# Patient Record
Sex: Female | Born: 1950 | Race: Black or African American | Hispanic: No | Smoking: Former smoker
Health system: Southern US, Community
[De-identification: ages and names within clinical notes are randomized; demographics above are authoritative.]

## PROBLEM LIST (undated history)

## (undated) DIAGNOSIS — I219 Acute myocardial infarction, unspecified: Secondary | ICD-10-CM

## (undated) DIAGNOSIS — R079 Chest pain, unspecified: Secondary | ICD-10-CM

## (undated) DIAGNOSIS — K5792 Diverticulitis of intestine, part unspecified, without perforation or abscess without bleeding: Secondary | ICD-10-CM

## (undated) DIAGNOSIS — G8929 Other chronic pain: Secondary | ICD-10-CM

## (undated) DIAGNOSIS — E669 Obesity, unspecified: Secondary | ICD-10-CM

## (undated) DIAGNOSIS — I10 Essential (primary) hypertension: Secondary | ICD-10-CM

## (undated) DIAGNOSIS — IMO0001 Reserved for inherently not codable concepts without codable children: Secondary | ICD-10-CM

## (undated) DIAGNOSIS — I428 Other cardiomyopathies: Secondary | ICD-10-CM

## (undated) DIAGNOSIS — M1990 Unspecified osteoarthritis, unspecified site: Secondary | ICD-10-CM

## (undated) DIAGNOSIS — E039 Hypothyroidism, unspecified: Secondary | ICD-10-CM

## (undated) DIAGNOSIS — I739 Peripheral vascular disease, unspecified: Secondary | ICD-10-CM

## (undated) DIAGNOSIS — M797 Fibromyalgia: Secondary | ICD-10-CM

## (undated) DIAGNOSIS — F329 Major depressive disorder, single episode, unspecified: Secondary | ICD-10-CM

## (undated) DIAGNOSIS — E7800 Pure hypercholesterolemia, unspecified: Secondary | ICD-10-CM

## (undated) DIAGNOSIS — F1910 Other psychoactive substance abuse, uncomplicated: Secondary | ICD-10-CM

## (undated) DIAGNOSIS — F32A Depression, unspecified: Secondary | ICD-10-CM

## (undated) HISTORY — PX: ABDOMINAL HYSTERECTOMY: SHX81

## (undated) HISTORY — DX: Other chronic pain: G89.29

## (undated) HISTORY — PX: HAND SURGERY: SHX662

## (undated) HISTORY — DX: Obesity, unspecified: E66.9

## (undated) HISTORY — DX: Chest pain, unspecified: R07.9

## (undated) HISTORY — PX: OTHER SURGICAL HISTORY: SHX169

## (undated) HISTORY — PX: KIDNEY STONE SURGERY: SHX686

## (undated) HISTORY — PX: CARDIAC CATHETERIZATION: SHX172

## (undated) HISTORY — DX: Pure hypercholesterolemia, unspecified: E78.00

## (undated) HISTORY — PX: BACK SURGERY: SHX140

## (undated) HISTORY — PX: SHOULDER SURGERY: SHX246

---

## 1998-03-29 ENCOUNTER — Inpatient Hospital Stay (HOSPITAL_COMMUNITY): Admission: EM | Admit: 1998-03-29 | Discharge: 1998-04-03 | Payer: Self-pay | Admitting: *Deleted

## 1998-06-15 ENCOUNTER — Encounter: Payer: Self-pay | Admitting: Radiation Oncology

## 1998-06-15 ENCOUNTER — Inpatient Hospital Stay (HOSPITAL_COMMUNITY): Admission: EM | Admit: 1998-06-15 | Discharge: 1998-06-17 | Payer: Self-pay | Admitting: Emergency Medicine

## 1998-12-19 ENCOUNTER — Encounter: Payer: Self-pay | Admitting: Emergency Medicine

## 1998-12-19 ENCOUNTER — Emergency Department (HOSPITAL_COMMUNITY): Admission: EM | Admit: 1998-12-19 | Discharge: 1998-12-19 | Payer: Self-pay | Admitting: Emergency Medicine

## 1999-03-03 ENCOUNTER — Inpatient Hospital Stay (HOSPITAL_COMMUNITY): Admission: EM | Admit: 1999-03-03 | Discharge: 1999-03-05 | Payer: Self-pay | Admitting: Emergency Medicine

## 1999-05-30 ENCOUNTER — Ambulatory Visit (HOSPITAL_COMMUNITY): Admission: RE | Admit: 1999-05-30 | Discharge: 1999-05-30 | Payer: Self-pay | Admitting: Family Medicine

## 1999-05-30 ENCOUNTER — Encounter: Payer: Self-pay | Admitting: Family Medicine

## 1999-05-31 ENCOUNTER — Encounter: Payer: Self-pay | Admitting: Emergency Medicine

## 1999-05-31 ENCOUNTER — Emergency Department (HOSPITAL_COMMUNITY): Admission: EM | Admit: 1999-05-31 | Discharge: 1999-05-31 | Payer: Self-pay | Admitting: Emergency Medicine

## 1999-07-22 ENCOUNTER — Emergency Department (HOSPITAL_COMMUNITY): Admission: EM | Admit: 1999-07-22 | Discharge: 1999-07-22 | Payer: Self-pay | Admitting: Emergency Medicine

## 1999-10-11 ENCOUNTER — Emergency Department (HOSPITAL_COMMUNITY): Admission: EM | Admit: 1999-10-11 | Discharge: 1999-10-11 | Payer: Self-pay | Admitting: Emergency Medicine

## 1999-11-13 ENCOUNTER — Emergency Department (HOSPITAL_COMMUNITY): Admission: EM | Admit: 1999-11-13 | Discharge: 1999-11-13 | Payer: Self-pay

## 1999-12-10 ENCOUNTER — Encounter: Admission: RE | Admit: 1999-12-10 | Discharge: 1999-12-10 | Payer: Self-pay | Admitting: Orthopedic Surgery

## 1999-12-10 ENCOUNTER — Encounter: Payer: Self-pay | Admitting: Orthopedic Surgery

## 1999-12-29 ENCOUNTER — Emergency Department (HOSPITAL_COMMUNITY): Admission: EM | Admit: 1999-12-29 | Discharge: 1999-12-29 | Payer: Self-pay | Admitting: Emergency Medicine

## 2000-01-08 ENCOUNTER — Emergency Department (HOSPITAL_COMMUNITY): Admission: EM | Admit: 2000-01-08 | Discharge: 2000-01-08 | Payer: Self-pay | Admitting: Emergency Medicine

## 2000-07-08 ENCOUNTER — Inpatient Hospital Stay (HOSPITAL_COMMUNITY): Admission: EM | Admit: 2000-07-08 | Discharge: 2000-07-15 | Payer: Self-pay | Admitting: *Deleted

## 2000-07-29 ENCOUNTER — Emergency Department (HOSPITAL_COMMUNITY): Admission: EM | Admit: 2000-07-29 | Discharge: 2000-07-29 | Payer: Self-pay | Admitting: Emergency Medicine

## 2000-08-05 ENCOUNTER — Emergency Department (HOSPITAL_COMMUNITY): Admission: EM | Admit: 2000-08-05 | Discharge: 2000-08-05 | Payer: Self-pay | Admitting: Emergency Medicine

## 2000-08-05 ENCOUNTER — Encounter: Payer: Self-pay | Admitting: Emergency Medicine

## 2000-11-12 ENCOUNTER — Encounter: Admission: RE | Admit: 2000-11-12 | Discharge: 2000-12-09 | Payer: Self-pay | Admitting: Orthopedic Surgery

## 2000-12-14 ENCOUNTER — Emergency Department (HOSPITAL_COMMUNITY): Admission: EM | Admit: 2000-12-14 | Discharge: 2000-12-14 | Payer: Self-pay | Admitting: *Deleted

## 2000-12-26 ENCOUNTER — Encounter: Payer: Self-pay | Admitting: Cardiology

## 2000-12-26 ENCOUNTER — Ambulatory Visit (HOSPITAL_COMMUNITY): Admission: RE | Admit: 2000-12-26 | Discharge: 2000-12-26 | Payer: Self-pay | Admitting: Cardiology

## 2001-01-06 ENCOUNTER — Emergency Department (HOSPITAL_COMMUNITY): Admission: EM | Admit: 2001-01-06 | Discharge: 2001-01-06 | Payer: Self-pay | Admitting: Emergency Medicine

## 2001-01-13 ENCOUNTER — Inpatient Hospital Stay (HOSPITAL_COMMUNITY): Admission: EM | Admit: 2001-01-13 | Discharge: 2001-01-16 | Payer: Self-pay | Admitting: Emergency Medicine

## 2001-01-13 ENCOUNTER — Encounter: Payer: Self-pay | Admitting: Emergency Medicine

## 2001-01-15 ENCOUNTER — Encounter: Payer: Self-pay | Admitting: Cardiology

## 2001-03-03 ENCOUNTER — Encounter (INDEPENDENT_AMBULATORY_CARE_PROVIDER_SITE_OTHER): Payer: Self-pay | Admitting: *Deleted

## 2001-03-03 ENCOUNTER — Encounter: Payer: Self-pay | Admitting: Emergency Medicine

## 2001-03-03 ENCOUNTER — Inpatient Hospital Stay (HOSPITAL_COMMUNITY): Admission: EM | Admit: 2001-03-03 | Discharge: 2001-03-11 | Payer: Self-pay

## 2001-03-04 ENCOUNTER — Encounter (HOSPITAL_BASED_OUTPATIENT_CLINIC_OR_DEPARTMENT_OTHER): Payer: Self-pay | Admitting: General Surgery

## 2001-03-05 ENCOUNTER — Encounter (HOSPITAL_BASED_OUTPATIENT_CLINIC_OR_DEPARTMENT_OTHER): Payer: Self-pay | Admitting: General Surgery

## 2001-03-06 ENCOUNTER — Encounter (HOSPITAL_BASED_OUTPATIENT_CLINIC_OR_DEPARTMENT_OTHER): Payer: Self-pay | Admitting: General Surgery

## 2001-05-07 ENCOUNTER — Emergency Department (HOSPITAL_COMMUNITY): Admission: EM | Admit: 2001-05-07 | Discharge: 2001-05-07 | Payer: Self-pay

## 2001-06-07 ENCOUNTER — Emergency Department (HOSPITAL_COMMUNITY): Admission: EM | Admit: 2001-06-07 | Discharge: 2001-06-07 | Payer: Self-pay | Admitting: Emergency Medicine

## 2001-11-07 ENCOUNTER — Emergency Department (HOSPITAL_COMMUNITY): Admission: EM | Admit: 2001-11-07 | Discharge: 2001-11-07 | Payer: Self-pay | Admitting: Emergency Medicine

## 2001-12-14 ENCOUNTER — Emergency Department (HOSPITAL_COMMUNITY): Admission: EM | Admit: 2001-12-14 | Discharge: 2001-12-14 | Payer: Self-pay | Admitting: Emergency Medicine

## 2007-10-15 HISTORY — PX: OTHER SURGICAL HISTORY: SHX169

## 2007-12-20 ENCOUNTER — Emergency Department (HOSPITAL_COMMUNITY): Admission: EM | Admit: 2007-12-20 | Discharge: 2007-12-20 | Payer: Self-pay | Admitting: Emergency Medicine

## 2008-05-11 ENCOUNTER — Emergency Department (HOSPITAL_COMMUNITY): Admission: EM | Admit: 2008-05-11 | Discharge: 2008-05-11 | Payer: Self-pay | Admitting: Emergency Medicine

## 2008-05-12 ENCOUNTER — Emergency Department (HOSPITAL_COMMUNITY): Admission: EM | Admit: 2008-05-12 | Discharge: 2008-05-12 | Payer: Self-pay | Admitting: Emergency Medicine

## 2008-09-06 ENCOUNTER — Emergency Department (HOSPITAL_COMMUNITY): Admission: EM | Admit: 2008-09-06 | Discharge: 2008-09-07 | Payer: Self-pay | Admitting: *Deleted

## 2008-10-12 ENCOUNTER — Emergency Department (HOSPITAL_COMMUNITY): Admission: EM | Admit: 2008-10-12 | Discharge: 2008-10-12 | Payer: Self-pay | Admitting: Family Medicine

## 2008-11-05 ENCOUNTER — Encounter: Admission: RE | Admit: 2008-11-05 | Discharge: 2008-11-05 | Payer: Self-pay | Admitting: Sports Medicine

## 2009-01-03 ENCOUNTER — Emergency Department (HOSPITAL_COMMUNITY): Admission: EM | Admit: 2009-01-03 | Discharge: 2009-01-03 | Payer: Self-pay | Admitting: Emergency Medicine

## 2009-08-18 ENCOUNTER — Emergency Department (HOSPITAL_COMMUNITY): Admission: EM | Admit: 2009-08-18 | Discharge: 2009-08-18 | Payer: Self-pay | Admitting: Emergency Medicine

## 2009-12-06 ENCOUNTER — Inpatient Hospital Stay (HOSPITAL_COMMUNITY): Admission: EM | Admit: 2009-12-06 | Discharge: 2009-12-09 | Payer: Self-pay | Admitting: Emergency Medicine

## 2010-04-13 ENCOUNTER — Encounter: Admission: RE | Admit: 2010-04-13 | Discharge: 2010-04-13 | Payer: Self-pay | Admitting: Internal Medicine

## 2011-01-04 LAB — COMPREHENSIVE METABOLIC PANEL
ALT: 34 U/L (ref 0–35)
ALT: 35 U/L (ref 0–35)
AST: 39 U/L — ABNORMAL HIGH (ref 0–37)
Albumin: 3.7 g/dL (ref 3.5–5.2)
Albumin: 4.1 g/dL (ref 3.5–5.2)
Alkaline Phosphatase: 43 U/L (ref 39–117)
BUN: 13 mg/dL (ref 6–23)
CO2: 29 mEq/L (ref 19–32)
CO2: 29 mEq/L (ref 19–32)
Calcium: 9.9 mg/dL (ref 8.4–10.5)
Chloride: 103 mEq/L (ref 96–112)
Chloride: 99 mEq/L (ref 96–112)
Creatinine, Ser: 0.74 mg/dL (ref 0.4–1.2)
Creatinine, Ser: 0.79 mg/dL (ref 0.4–1.2)
GFR calc non Af Amer: >60 mL/min (ref >60)
GFR calc non Af Amer: >60 mL/min (ref >60)
Potassium: 3.4 mEq/L — ABNORMAL LOW (ref 3.5–5.1)
Sodium: 139 mEq/L (ref 135–145)
Total Bilirubin: 1.2 mg/dL (ref 0.3–1.2)
Total Protein: 8.8 g/dL — ABNORMAL HIGH (ref 6.0–8.3)

## 2011-01-04 LAB — STOOL CULTURE

## 2011-01-04 LAB — BASIC METABOLIC PANEL
CO2: 26 mEq/L (ref 19–32)
CO2: 28 mEq/L (ref 19–32)
Chloride: 109 mEq/L (ref 96–112)
Creatinine, Ser: 0.72 mg/dL (ref 0.4–1.2)
GFR calc Af Amer: >60 mL/min (ref >60)
GFR calc Af Amer: >60 mL/min (ref >60)
Glucose, Bld: 140 mg/dL — ABNORMAL HIGH (ref 70–99)
Potassium: 2.9 mEq/L — ABNORMAL LOW (ref 3.5–5.1)
Potassium: 3.5 mEq/L (ref 3.5–5.1)
Sodium: 143 mEq/L (ref 135–145)

## 2011-01-04 LAB — CBC
HCT: 40.7 % (ref 36.0–46.0)
Hemoglobin: 14 g/dL (ref 12.0–15.0)
MCHC: 34.2 g/dL (ref 30.0–36.0)
Platelets: 233 10*3/uL (ref 150–400)
RBC: 3.99 MIL/uL (ref 3.87–5.11)
RBC: 4.31 MIL/uL (ref 3.87–5.11)
RDW: 12.3 % (ref 11.5–15.5)
RDW: 12.4 % (ref 11.5–15.5)

## 2011-01-04 LAB — CARDIAC PANEL(CRET KIN+CKTOT+MB+TROPI)
CK, MB: 1.9 ng/mL (ref 0.3–4.0)
Relative Index: 1.1 (ref 0.0–2.5)

## 2011-01-04 LAB — RAPID URINE DRUG SCREEN, HOSP PERFORMED
Amphetamines: NOT DETECTED
Benzodiazepines: NOT DETECTED
Cocaine: NOT DETECTED
Opiates: POSITIVE — AB

## 2011-01-04 LAB — URINALYSIS, ROUTINE W REFLEX MICROSCOPIC
Bilirubin Urine: NEGATIVE
Protein, ur: 30 mg/dL — AB
Specific Gravity, Urine: 1.024 (ref 1.005–1.030)
pH: 7.5 (ref 5.0–8.0)

## 2011-01-04 LAB — DIFFERENTIAL
Basophils Absolute: 0 10*3/uL (ref 0.0–0.1)
Basophils Relative: 0 % (ref 0–1)
Eosinophils Absolute: 0 10*3/uL (ref 0.0–0.7)
Lymphocytes Relative: 6 % — ABNORMAL LOW (ref 12–46)

## 2011-01-04 LAB — CLOSTRIDIUM DIFFICILE EIA

## 2011-01-04 LAB — HEMOGLOBIN A1C
Hgb A1c MFr Bld: 5.8 % (ref 4.6–6.1)
Mean Plasma Glucose: 120 mg/dL

## 2011-01-04 LAB — FECAL LACTOFERRIN, QUANT

## 2011-01-04 LAB — URINE MICROSCOPIC-ADD ON

## 2011-01-04 LAB — TSH: TSH: 0.023 u[IU]/mL — ABNORMAL LOW (ref 0.350–4.500)

## 2011-01-04 LAB — LIPASE, BLOOD: Lipase: 26 U/L (ref 11–59)

## 2011-01-04 LAB — MAGNESIUM: Magnesium: 1.5 mg/dL (ref 1.5–2.5)

## 2011-01-16 LAB — GLUCOSE, CAPILLARY: Glucose-Capillary: 107 mg/dL — ABNORMAL HIGH (ref 70–99)

## 2011-03-01 NOTE — H&P (Signed)
[facility]  Patient:    Tracy Buckley, Tracy Buckley                     MRN: [id number] Adm. Date:  [id number] Attending:  Denny Peon Dictator:   Valinda Hoar, N.P.                         History and Physical  REVIEW OF SYSTEMS:  GENERAL:  The patient is a 60 year old African-American married female.  (Note:  She answers yes to a lot of questions which are really unconfirmed).  She states at time she has a low grade fever.  She has headache, myalgia, insomnia, has falls sometimes, complains of severe fatigue. She feels weak at times.  She has had night sweats for over six months.  She is obese and she does acknowledge she does not eat right and so her nutrition is poor.  Her medical doctor is Dr. Holley Bouche, who she last saw a week ago for pain as well as chest pain. Dr. Tiburcio Pea told her that the pain appeared to be related to spasms in her back.  There were no any classic signs or symptoms of heart disease according to the patient and Dr. Tiburcio Pea.  EYES:  The patient complains of double vision.  I asked her if she was seeing two of everything and she appeared to be describing blurred vision versus diplopia, but she called it double vision.  She does have impaired vision and wears glasses.  ENT/MOUTH: The patient does state she has some dental pain and she does snore.  No dentures.  No history of tonsillectomy.  CARDIOVASCULAR:  She has complained of chest pain, nausea, some vomiting.  She does have hypertension and hyperlipidemia.  She reports that her chest has been sore for the past couple days and states it occurs with her back pain.  RESPIRATORY: The patient says she has a nonproductive cough.  She does not smoke.  She says she has shortness of breath when she gets very tired.  GASTROINTESTINAL:  The patient complains of nausea, vomiting, diarrhea, bloating, hemorrhoids.  She states sometimes her stools have been dark, particularly in the past  three days.  She has hemorrhoids, gastroesophageal reflux disease and she states her stomach hurts in the lower left and right quadrants.  She has had no cholecystectomy, no appendectomy.  She does have a history of ulcer two years ago.  GENITOURINARY: The patient states she has some urinary frequency and stress incontinence.  She is postmenopausal.  She had a complete hysterectomy in 1996.  MUSCULOSKELETAL:  She reports having carpal tunnel in both hands, otherwise, no problems.  SKIN:  She states she gets a rash and has eczema at times.  NEUROLOGICAL:  She complains of a headache.  She denies alcohol abuse.  PSYCHIATRIC:  Complains of depression, insomnia, along with severe marital problems.  ENDOCRINE:  She has dry skin.  She states her hair is dry.  She has hypothyroidism, she is always thirsty and she states this has been going on for about two years.  She did have a TSH of 16.158.  Dr. Tiburcio Pea saw her last week and increased her Synthroid to 0.2 mg.  HEMATOLOGY/LYMPH:  She states sometimes her lymph nodes are enlarged.  She does have a history of anemia. Her CBC with differential shows a red blood count of 3.52 decreased, hemoglobin 11.2 decreased and hematocrit 33.9 decreased.  Also her glucose  is slightly elevated at 124.  ALLERGIES:  She states she has seasonal allergies. Her urine drug screen came back positive for cocaine, benzodiazepines and opiates.  She adamantly denies she uses cocaine.  She did use cocaine one year ago but she says she has not used in one year.  She states she smoked a joint but that was all.  PHYSICAL EXAMINATION:  VITAL SIGNS:  Temperature 97.5, pulse 101, respiratory rate 18, blood pressure 136/95, height 5 feet 2 inches, weight 226 pounds.  GENERAL APPEARANCE:  The patient is a 60 year old  African-American female sitting on the exam table.  She seems somewhat uncomfortable, lying down a few times, apparently for some back pain she was experiencing.   She is obese in stature.  She is obese in stature.  She appears her stated age.  She is cooperative but somewhat unkempt.  HEAD:  Normocephalic.  She can raise her eyebrows.  EYES:  Pupils are equal, round and reactive to light.  Assessed her EOMs. The patient performed the exam but stated she saw two of my finger when looking to the right and to the left.  Her funduscopic exam was within normal limits.  ENT/MOUTH:  Her tympanic membranes are intact with normal cone of light. Nose:  No nasal discharge, No sinus tenderness.  Mouth:  Mucosa is moist, she has fair dentition.  No lesions were seen.  Her tongue protrudes midline without tremor.  She can clench her teeth and puff out cheeks.  Her uvula is midline.  There is no pharyngeal exudate noted.  NECK:  No JVD, negative lymphadenopathy.  Thyroid is nontender, seems somewhat enlarged.  Her trachea is midline.  LUNGS:  Breath sounds are clear to auscultation with no adventitious sounds.  CARDIOVASCULAR:  Heart is regular rate and rhythm without murmurs, gallops or rubs appreciated.  Her carotid pulses were equal and adequate bilaterally, no carotid bruits were auscultated.  Her pedal pulses are equal and adequate. No edema or varicosities were noted.  ABDOMEN:  Obese, soft, tender to palpation in in the mid and lower epigastric area.  No masses or organomegaly noted.  She has active bowel sounds, no CVA tenderness.  She has a well-healed, vertical midline scar.  MUSCULOSKELETAL:  There is no joint swelling or deformity.  She has good muscle strength and tone are equal bilaterally.  SKIN:  Warm and very dry.  Nail beds are pink with good capillary refill.  NEUROLOGICAL:  She is oriented x 3.  Cranial nerves are grossly intact.  Her deep tendon reflexes 2+.  She has good grip strength bilaterally with no involuntary movement.  Gait was assessed but it was difficult for patient to follow thought.  Gait was unsteady, possibly from a  Phenergan injection she had gotten earlier, prior to her physical examination.  Cerebellar function: Romberg is negative.  Coordination:  She was unable to perform finger-to-nose. D:  07/09/00  TD:  07/10/00 Job: [id number]

## 2011-03-01 NOTE — Discharge Summary (Signed)
[place]  Patient:    Tracy Buckley, Tracy Buckley                     MRN: [id number] Adm. Date:  [id number] Disc. Date: [id number] Attending:  Sonda Primes Dictator:   Lyla Son. Achilles Dunk.                           Discharge Summary  DISCHARGE DIAGNOSES: 1. Congestive heart failure. 2. Cardiomyopathy. 3. Hypertension. 4. Morbid obesity. 5. Cocaine abuse.  CONSULTING PHYSICIANS:  Eduardo Osier. Sharyn Lull, M.D.  PROCEDURE:  Cardiac catheterization, January 15, 2001.  HISTORY OF PRESENT ILLNESS:  This is a 60 year old female who presented initially to the emergency department of [facility] on January 13, 2001, with complaint of chest pain and shortness of breath.  She stated that this problem started on January 12, 2001, and was escalating.  At the time that she arrived in the emergency department she rated the pain as an 8 on a scale of 1 to 10 with radiation into the right arm.  This was also accompanied by nausea.  It is of note that the patient recently had a Cardiolite study which revealed an ejection fraction of only 28%.  She has been started on Coreg.  LABORATORY DATA:  The electrocardiogram revealed T wave inversion in leads aVL, V3 through V6.  The chest x-ray revealed decreased volume with no focal consolidation.  The complete blood count was within normal limits.  The chemistries were within normal limits.   The CK was elevated at 392 with an MB of only 2.3.  The troponin was elevated at 0.6.  The patient was subsequently admitted for further evaluation and treatment of his problem.  HOSPITAL COURSE:  The patient was admitted to the medical service, placed on telemetry, treated with oxygen therapy.  She had serial CK-MBs as well as troponin studies continued on Coreg and Lovenox by pharmacy protocol as well as Lasix therapy and Zestoril.  She was placed on an 1800 calorie low fat diet. The patient was seen on January 14, 2001, by  Dr. Sharyn Lull who was of the opinion was suffering from recurrent chest pain and positive  Cardiolite studies.  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  Plans were then made for cardiac catheterization and after informed consent, the patient was taken to the cardiac catheterization lab.  There was noted global hypokinesis with mild anterolateral wall akinesis, ejection fraction of only 25 to 35%.  The diagonal 1 and diagonal 3 branches were noted to be very small, but patent. The obtuse marginal was also noted to be very small, but patent.  The right coronary artery was also noted to be small, but patent.  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  The patient had abdominal x-rays which showed some constipation.  Laxatives were given and plans were made for the patient to be discharged home.  On January 16, 2001, it was the opinion of the attending physician and the consulting physician that the patient had received maximum benefit from this hospitalization and could be discharged home without problem.  DISCHARGE MEDICATIONS: 1. Synthroid 50 mcg daily. 2. Zestoril 20 mg daily. 3. Protonix 40 mg daily. 4. Potassium 20 mg daily. 5. Coreg  6.25 one b.i.d. 6. Lasix 40 mg daily. 7. Valium 10 mg three times daily. 8. Vicodin one every four to six hours as needed. 9. Aspirin one daily.  DIET:  The patient is to remain on low fat, 2 gram sodium restricted diet.  FOLLOW-UP: The patient is to be followed up in the office in two weeks or sooner if any changes, problems, or complications. DD:  03/20/01 TD:  03/21/01 Job: [id number]
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
## 2011-03-01 NOTE — Cardiovascular Report (Signed)
[place]  Patient:    Tracy Buckley, Tracy Buckley                     MRN: [id number] Proc. Date: 01/15/01 Adm. Date:  [id number] Attending:  Pola Corn CC:         Cardiac Catheterization Laboratory  Osvaldo Shipper. Spruill, M.D.   Cardiac Catheterization  PROCEDURE:  Left cardiac catheterization with selective right and left coronary angiography, left ventriculography via right groin using Judkins technique.  INDICATIONS FOR PROCEDURE:  Ms. Stammen is a 60 year old, black female, with a past medical history significant for hypertension, hypothyroidism, questionable history of peptic ulcer disease, hiatal hernia, morbid obesity, degenerative joint disease, status post multiple surgeries in the past, history of cocaine abuse, was admitted yesterday because of progressive increasing shortness of breath with minimal exertion associated with leg swelling.  The patient also gives history of recurrent chest pain with minimal exertion or when under stress.  She had a Persantine Cardiolite on March 15 which showed intra-apical and septal wall ischemia and lateral wall scarring with ejection fraction of 28%.  The patient denies any palpitations, lightheadedness or syncope, also complains of vague abdominal pain, especially after eating food, associated with nausea.  The patient states she feels tired, weak and has no energy.  PAST MEDICAL HISTORY:  As above.  PAST SURGICAL HISTORY:  Status post bilateral carpal tunnel surgery in August of 2001 and in 1995.  Status post endometriosis surgery x 2, in 1982 and 1984.  She had a hysterectomy in 1993 and ______ in 1994.  She had right shoulder surgery in January of 2002.  She had back surgery for a questionable herniated disk in 1995.  ALLERGIES:  No known drug allergies.  SOCIAL HISTORY:  She is separated, born in [place].  Smoked half pack-per-day for one year, quit 10 years ago.  Used to smoke cocaine  for approximately one year, quit two years ago.  No history of alcohol abuse. Presently, she is on disability, worked as a Occupational psychologist for [employer].  FAMILY HISTORY:  Father died of MI at the age of 50.  He was hypertensive.  He had a history of heart failure.  Mother died of ______ cancer at the age of 56.  One sister is hypertension and she has questionable heart problems.  One brother died of AIDS.  LABORATORY DATA:  Three sets of CPKs, total was 392, MB of 2.3, second ______ set 30, MB of 6.2, third set CPK was 1124, MB of 5.2.  Troponins, two sets were negative; they were 0.02.  ECG showed sinus tachycardia with nonspecific T wave changes.  Due to recurrent chest pain, typical anginal pain and positive Persantine Cardiolite and multiple risk factors, the patient was advised for catheterization, possible angioplasty.  DESCRIPTION OF PROCEDURE:  After obtaining the informed consent, the patient was brought to the catheterization lab and was placed on the fluoroscopy table.  The right groin was prepped and draped in the usual fashion. Xylocaine 2% was used for local anesthesia in the right groin.  With the help of a thin-walled needle, a 6 French arterial sheath was placed.  The sheath was aspirated and flushed.  Next, a 6 French left Judkins catheter was advanced over the wire under fluoroscopic guidance up to the ascending aorta. The wire was pulled out, the catheter was aspirated and connected to the manifold.  The catheter was further advanced and engaged into the left  coronary ostium.  Multiple views of the left system were taken.  Next, the catheter was disengaged and was pulled out over the wire and was replaced with a 6 French right Judkins catheter which was advanced over the wire under fluoroscopic guidance up to the ascending aorta.  The wire was pulled out, the catheter was aspirated and connected to the manifold.  The catheter was further advanced  to the right coronary ostium.  Multiple views of the right system were taken.  Next, the catheter was disengaged and was pulled out over the wire and was replaced with a 6 French pigtail catheter which was advanced over the wire under fluoroscopic guidance up to the ascending aorta.  The wire was pulled out.  The catheter was aspirated and connected to the manifold. The catheter was further advanced across the aortic valve into the LV.  LV pressures were recorded.  Next, LV graph was done in a 30 degree RAO position Post angiographic pressures were recorded from LV and then pullback pressures were recorded from the aorta.  There was no gradient across the aortic valve. Next, the pigtail catheter was pulled out over the wire, sheaths were aspirated and flushed.  Arteriotomy was closed by Perclose at the end of the procedure.  The patient tolerated the procedure well.  There were no complications.  FINDINGS:  LV showed global hypokinesia with mild anterolateral wall dyskinesia.  EF of 25-30%.  Left main was patent.  LAD was patent.  Diagonal #1, 2, 3 were very small which were patent.  Left circumflex was patent proximally and in midportion and then tapers down into AV groove.  OM-1 was very small which was patent.  OM-2 and OM-3 were small which were patent.  RCA was small which was patent.  The patient was transferred to recovery room in stable condition. DD:  01/15/01 TD:  01/15/01 Job: [id number]

## 2011-03-01 NOTE — Op Note (Signed)
[place]  Patient:    DALANEY, NEEDLE                     MRN: [id number] Proc. Date: 03/05/01 Adm. Date:  [id number] Attending:  Sonda Primes                           Operative Report  PREOPERATIVE DIAGNOSIS:  Acute cholecystitis.  POSTOPERATIVE DIAGNOSIS:  Acute gangrenous cholecystitis.  PROCEDURE:  Laparoscopic cholecystectomy with intraoperative cholangiogram.  SURGEON:  Luisa Hart L. Lurene Shadow, M.D.  ASSISTANT:  Marnee Spring. Wiliam Ke, M.D.  ANESTHESIA:  General.  CLINICAL NOTE:  The patient is a 60 year old woman with a history of congestive heart failure admitted through the emergency room in severe abdominal pain, nausea, and vomiting.  Liver function studies were within normal limits, and gallbladder ultrasound shows cholelithiasis.  She continued to have pain after admission and observation.  Hepatobiliary scan demonstrated cystic duct obstruction.  She has been brought to the operating room for cholecystectomy.  DESCRIPTION OF PROCEDURE:  Following the induction of anesthesia, the patient was positioned supinely.  The abdomen was routinely prepped and draped to be included in the sterile operative field.  Open laparoscopy created at the umbilicus with insufflation of the peritoneal cavity to 14 mmHg pressure and the camera inserted.  Visual exploration showed a very large phlegmon overlying the gallbladder.  Liver edges were sharp and liver surfaces smooth. Small and large intestine viewed appeared to be normal.  There were multiple adhesions in the pelvis, and pelvic organs were not visualized.  Under direct vision, epigastric and lateral ports were placed.  The gallbladder is then dissected free from the large phlegmon and aspirated of its contents because it was severely hydropic.  There were multiple areas of gangrene over the surface of the gallbladder.  The gallbladder was grasped and retracted cephalad, dissection carried  down to the hepatoduodenal ligament, with isolation of the cystic artery and cystic duct, cystic artery being traced up to the gallbladder wall, the cystic duct being traced to the gallbladder-cystic duct junction.  The cystic artery doubly clipped and transected.  Cystic duct clipped proximally and opened.  Cystic duct cholangiogram carried out with Reddick catheter, injecting one-half strength Hypaque into the biliary system.  Resulting cholangiogram was normal.  The cystic duct catheter was removed and the cystic duct doubly clipped and transected.  The gallbladder was then dissected free from the liver bed and maintaining hemostasis throughout the course of the dissection with electrocautery.  At the end of the dissection, because of the gangrenous aspect of the gallbladder, a breach was made in the gallbladder and a single stone was extruded.  This was recovered.  The camera moved to the epigastric port and the gallbladder retrieved through the umbilical port through the Endocatch pouch.  The right upper quadrant was thoroughly irrigated with normal saline, all areas of dissection checked for hemostasis and noted to be dry.  Sponge, instrument, and sharp counts were verified.  The wound closed in layers as follows:  After the pneumoperitoneum is deflated, the epigastric and lateral flank wounds closed with 4-0 Dexon and the umbilical wound closed in two layers with 0 Dexon and 4-0 Dexon.  All wounds were reinforced with Steri-Strips, and sterile dressings applied.  Anesthetic reversed.  Patient removed from the operating room to the recovery room in stable condition.  She tolerated the procedure well. DD:  03/05/01 TD:  03/06/01 Job: [id number]
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
## 2011-03-01 NOTE — Discharge Summary (Signed)
[facility]  Patient:    Tracy Buckley, Tracy Buckley                     MRN: [id number] Adm. Date:  [id number] Attending:  Sonda Primes                           Discharge Summary  ADMISSION DIAGNOSES: 1. Acute biliary colic. 2. Hypertension. 3. Congestive heart failure with idiopathic cardiomyopathy.  DISCHARGE DIAGNOSES: 1. Gangrenous cholecystitis. 2. Idiopathic cardiomyopathy. 3. Hypertension.  PROCEDURE:  Laparoscopic cholecystectomy and operative cholangiogram.  COMPLICATIONS:  Postoperative hypotension.  CONDITION ON DISCHARGE:  Improved.  HISTORY OF PRESENT ILLNESS:  The patient is a 60 year old woman with a known idiopathic cardiomyopathy with severe congestive heart failure having an ejection fraction of approximately 25%.  She presented with severe upper abdominal pain associated with nausea and vomiting.  Gallbladder ultrasound demonstrates cholelithiasis.  There were no other indications of any significant acute cholecystitis.  HOSPITAL COURSE:  On admission, she was afebrile with no icterus chills or fever.  Following admission, she continued to have severe abdominal pain.  She underwent a hepatobiliary scan which showed she had obstruction.  She was then prepared and brought to the operating room on Mar 05, 2001, where she underwent laparoscopic cholecystectomy with intraoperative cholangiogram. Postoperative course was marked with significant hypotension which was supported by volume and then dopamine.  Following volume repletion, she continued to do well, but because of her cardiomyopathy she was observed additionally to slowly taper her dopamine off.  At the time of discharge, she has a normal blood pressure and heart rate.  She had some problems with hypokalemia which has been repleted.  FOLLOWUP:  She is being discharged to be followed up in the office in two weeks by Dr. Lurene Shadow and one week by Dr.  [name].  ACTIVITY:  As tolerated.  DIET:  Unrestricted. DD:  03/11/01 TD:  03/11/01 Job: [id number]

## 2011-04-09 ENCOUNTER — Ambulatory Visit (INDEPENDENT_AMBULATORY_CARE_PROVIDER_SITE_OTHER): Payer: Medicare Other

## 2011-04-09 ENCOUNTER — Inpatient Hospital Stay (INDEPENDENT_AMBULATORY_CARE_PROVIDER_SITE_OTHER)
Admission: RE | Admit: 2011-04-09 | Discharge: 2011-04-09 | Disposition: A | Discharge status: Home / Self Care | Payer: Medicare Other | Source: Ambulatory Visit | Attending: Emergency Medicine | Admitting: Emergency Medicine

## 2011-04-09 DIAGNOSIS — N390 Urinary tract infection, site not specified: Secondary | ICD-10-CM

## 2011-04-09 DIAGNOSIS — L299 Pruritus, unspecified: Secondary | ICD-10-CM

## 2011-04-09 LAB — POCT URINALYSIS DIP (DEVICE)
Ketones, ur: NEGATIVE mg/dL
Nitrite: NEGATIVE
Protein, ur: NEGATIVE mg/dL
Urobilinogen, UA: 4 mg/dL — ABNORMAL HIGH (ref 0.0–1.0)
pH: 6.5 (ref 5.0–8.0)

## 2011-04-09 LAB — WET PREP, GENITAL: Clue Cells Wet Prep HPF POC: NONE SEEN

## 2011-04-10 LAB — GC/CHLAMYDIA PROBE AMP, GENITAL: Chlamydia, DNA Probe: NEGATIVE

## 2011-04-10 LAB — URINE CULTURE: Colony Count: 30000

## 2011-07-08 LAB — DIFFERENTIAL
Basophils Absolute: 0
Basophils Relative: 0
Eosinophils Absolute: 0.1
Eosinophils Relative: 2
Lymphocytes Relative: 32
Lymphs Abs: 1.6
Monocytes Absolute: 0.4
Monocytes Relative: 9
Neutro Abs: 2.7
Neutrophils Relative %: 56

## 2011-07-08 LAB — I-STAT 8, (EC8 V) (CONVERTED LAB)
Acid-Base Excess: 5 — ABNORMAL HIGH
BUN: 24 — ABNORMAL HIGH
Bicarbonate: 29.4 — ABNORMAL HIGH
Chloride: 105
Glucose, Bld: 131 — ABNORMAL HIGH
HCT: 38
Hemoglobin: 12.9
Operator id: 294511
Potassium: 4.1
Sodium: 138
TCO2: 31
pCO2, Ven: 42.5 — ABNORMAL LOW
pH, Ven: 7.449 — ABNORMAL HIGH

## 2011-07-08 LAB — CBC
MCHC: 33.9
MCV: 91.6
RBC: 3.72 — ABNORMAL LOW

## 2011-07-08 LAB — POCT CARDIAC MARKERS
Myoglobin, poc: 346
Operator id: 282201
Operator id: 294511
Troponin i, poc: <0.05

## 2012-02-03 ENCOUNTER — Encounter: Payer: Self-pay | Admitting: *Deleted

## 2012-03-20 ENCOUNTER — Emergency Department (INDEPENDENT_AMBULATORY_CARE_PROVIDER_SITE_OTHER)
Admission: EM | Admit: 2012-03-20 | Discharge: 2012-03-20 | Disposition: A | Discharge status: Home / Self Care | Payer: Medicaid Other | Source: Home / Self Care

## 2012-03-20 ENCOUNTER — Encounter (HOSPITAL_COMMUNITY): Payer: Self-pay | Admitting: Emergency Medicine

## 2012-03-20 DIAGNOSIS — N390 Urinary tract infection, site not specified: Secondary | ICD-10-CM

## 2012-03-20 HISTORY — DX: Diverticulitis of intestine, part unspecified, without perforation or abscess without bleeding: K57.92

## 2012-03-20 LAB — POCT URINALYSIS DIP (DEVICE)
Hgb urine dipstick: NEGATIVE
Protein, ur: NEGATIVE mg/dL
Specific Gravity, Urine: 1.015 (ref 1.005–1.030)
Urobilinogen, UA: 1 mg/dL (ref 0.0–1.0)

## 2012-03-20 MED ORDER — TRAMADOL HCL 50 MG PO TABS: 50.0000 mg | ORAL_TABLET | Freq: Four times a day (QID) | ORAL | Status: AC | PRN

## 2012-03-20 MED ORDER — CEPHALEXIN 500 MG PO CAPS: 500.0000 mg | ORAL_CAPSULE | Freq: Two times a day (BID) | ORAL | Status: AC

## 2012-03-20 NOTE — Discharge Instructions (Signed)

## 2012-03-20 NOTE — ED Notes (Signed)
Pt here with flare up diverticulitis that started Monday after eating fried chicken and ice cream bars.no changes in stools.c/o intermit sharp,nagging pains that worsens then eases up

## 2012-03-20 NOTE — ED Provider Notes (Signed)
History     CSN: [id number]  Arrival date & time 03/20/12  1638   First MD Initiated Contact with Patient 03/20/12 1719      Chief Complaint  Patient presents with  . Diverticulitis    (Consider location/radiation/quality/duration/timing/severity/associated sxs/prior treatment) HPI  61 year old female with history of diverticulosis presents to urgent care with main concern of lower quadrants abdominal pain that started approximately week ago and has been getting slightly better. She reports pain is sharp, intermittent in nature, 5/10 in severity when present, nonradiating, no associated alleviating or aggravating factors. Patient denies fevers and chills, no specific other abdominal or urinary concerns except urinary frequency. Patient denies recent sicknesses, no sick contacts or exposures. Patient reports similar episodes in the past but is not sure what they were due to.   Past Medical History  Diagnosis Date  . Dilated cardiomyopathy   . Chronic chest pain   . HTN (hypertension)   . Hypercholesteremia     Past Surgical History  Procedure Date  . Back surgery     x2  . Tummy tuck 2009  . Hand surgery     x2  . Hysterectomy - unknown type   . Cesarean section   . Shoulder surgery   . Kidney stone surgery     Family History  Problem Relation Age of Onset  . Heart disease    . Cancer    . HIV    . Coronary artery disease    . Hypertension    . Diabetes      History  Substance Use Topics  . Smoking status: Former Smoker    Quit date: 10/14/1998  . Smokeless tobacco: Not on file  . Alcohol Use: No    OB History    Grav Para Term Preterm Abortions TAB SAB Ect Mult Living                  Review of Systems  Constitutional: Denies fever, chills, diaphoresis, appetite change and fatigue.  HEENT: Denies photophobia, eye pain, redness, hearing loss, ear pain, congestion, sore throat, rhinorrhea, sneezing, mouth sores, trouble swallowing, neck pain, neck  stiffness and tinnitus.   Respiratory: Denies SOB, DOE, cough, chest tightness,  and wheezing.   Cardiovascular: Denies chest pain, palpitations and leg swelling.  Gastrointestinal: Denies diarrhea, constipation, blood in stool and abdominal distention.  Genitourinary: Denies dysuria, urgency, frequency, hematuria, flank pain and difficulty urinating.  Musculoskeletal: Denies myalgias, back pain, joint swelling, arthralgias and gait problem.  Skin: Denies pallor, rash and wound.  Neurological: Denies dizziness, seizures, syncope, weakness, light-headedness, numbness and headaches.  Hematological: Denies adenopathy. Easy bruising, personal or family bleeding history  Psychiatric/Behavioral: Denies suicidal ideation, mood changes, confusion, nervousness, sleep disturbance and agitation    Allergies  Review of patient's allergies indicates no known allergies.  Home Medications   Current Outpatient Rx  Name Route Sig Dispense Refill  . ASPIRIN 81 MG PO TABS Oral Take 81 mg by mouth daily.    Marland Kitchen CARVEDILOL 3.125 MG PO TABS Oral Take 3.125 mg by mouth 2 (two) times daily with a meal.    . FLUTICASONE PROPIONATE 50 MCG/ACT NA SUSP Nasal Place 2 sprays into the nose daily.    Marland Kitchen HYDROCHLOROTHIAZIDE 25 MG PO TABS Oral Take 25 mg by mouth daily.    Marland Kitchen LISINOPRIL 20 MG PO TABS Oral Take 20 mg by mouth daily.    . MORPHINE SULFATE 30 MG PO TABS Oral Take 30 mg by mouth 3 (  three) times daily.    Marland Kitchen NITROGLYCERIN 0.4 MG SL SUBL Sublingual Place 0.4 mg under the tongue every 5 (five) minutes as needed.    Marland Kitchen PANTOPRAZOLE SODIUM 40 MG PO TBEC Oral Take 40 mg by mouth daily.    Marland Kitchen PREGABALIN 75 MG PO CAPS Oral Take 75 mg by mouth 2 (two) times daily.    Marland Kitchen SIMVASTATIN 20 MG PO TABS Oral Take 20 mg by mouth every evening.    Marland Kitchen TIZANIDINE HCL 4 MG PO TABS Oral Take 4 mg by mouth every 6 (six) hours as needed.      BP 110/84  Pulse 77  Temp(Src) 98.6 F (37 C) (Oral)  Resp 16  SpO2 98%  Physical  Exam  Constitutional: Vital signs reviewed.  Patient is a well-developed and well-nourished in no acute distress and cooperative with exam. Alert and oriented x3.  Head: Normocephalic and atraumatic Ear: TM normal bilaterally Mouth: no erythema or exudates, MMM Eyes: PERRL, EOMI, conjunctivae normal, No scleral icterus.  Neck: Supple, Trachea midline normal ROM, No JVD, mass, thyromegaly, or carotid bruit present.  Cardiovascular: RRR, S1 normal, S2 normal, no MRG, pulses symmetric and intact bilaterally Pulmonary/Chest: CTAB, no wheezes, rales, or rhonchi Abdominal: Soft. Tender in suprapubic area, non-distended, bowel sounds are normal, no masses, organomegaly, or guarding present.  GU: no CVA tenderness Musculoskeletal: No joint deformities, erythema, or stiffness, ROM full and no nontender  ED Course  Procedures  Abdominal pain - Unclear what the exact etiology is and questionable etiology of urinary tract infection given patient's symptoms - will treat with course of antibiotic - Will also provide analgesia for adequate pain control - Patient was advised to drink plenty of fluids and to followup with primary care physician if symptoms do not improve  MDM  Urinary tract infection, antibiotic provided        Dorothea Ogle, MD 03/20/12 1820

## 2012-04-02 ENCOUNTER — Encounter: Payer: Self-pay | Admitting: Cardiology

## 2012-04-02 ENCOUNTER — Other Ambulatory Visit: Payer: Self-pay | Admitting: Cardiology

## 2012-04-15 ENCOUNTER — Inpatient Hospital Stay (HOSPITAL_COMMUNITY)
Admission: EM | Admit: 2012-04-15 | Discharge: 2012-04-18 | DRG: 287 | Disposition: A | Discharge status: Home / Self Care | Payer: Medicare Other | Attending: Internal Medicine | Admitting: Internal Medicine

## 2012-04-15 ENCOUNTER — Encounter (HOSPITAL_COMMUNITY): Payer: Self-pay | Admitting: *Deleted

## 2012-04-15 DIAGNOSIS — R072 Precordial pain: Secondary | ICD-10-CM

## 2012-04-15 DIAGNOSIS — E876 Hypokalemia: Secondary | ICD-10-CM | POA: Diagnosis present

## 2012-04-15 DIAGNOSIS — E785 Hyperlipidemia, unspecified: Secondary | ICD-10-CM | POA: Diagnosis present

## 2012-04-15 DIAGNOSIS — I10 Essential (primary) hypertension: Secondary | ICD-10-CM

## 2012-04-15 DIAGNOSIS — R0789 Other chest pain: Principal | ICD-10-CM | POA: Diagnosis present

## 2012-04-15 DIAGNOSIS — I428 Other cardiomyopathies: Secondary | ICD-10-CM

## 2012-04-15 DIAGNOSIS — E782 Mixed hyperlipidemia: Secondary | ICD-10-CM

## 2012-04-15 DIAGNOSIS — R079 Chest pain, unspecified: Secondary | ICD-10-CM

## 2012-04-15 DIAGNOSIS — E669 Obesity, unspecified: Secondary | ICD-10-CM | POA: Diagnosis present

## 2012-04-15 DIAGNOSIS — Z6839 Body mass index (BMI) 39.0-39.9, adult: Secondary | ICD-10-CM

## 2012-04-15 HISTORY — DX: Essential (primary) hypertension: I10

## 2012-04-15 HISTORY — DX: Other psychoactive substance abuse, uncomplicated: F19.10

## 2012-04-15 HISTORY — DX: Major depressive disorder, single episode, unspecified: F32.9

## 2012-04-15 HISTORY — DX: Other cardiomyopathies: I42.8

## 2012-04-15 HISTORY — DX: Depression, unspecified: F32.A

## 2012-04-15 LAB — COMPREHENSIVE METABOLIC PANEL
ALT: 19 U/L (ref 0–35)
CO2: 31 mEq/L (ref 19–32)
Calcium: 9.2 mg/dL (ref 8.4–10.5)
Chloride: 100 mEq/L (ref 96–112)
GFR calc Af Amer: >90 mL/min (ref >90)
GFR calc non Af Amer: 89 mL/min — ABNORMAL LOW (ref >90)
Glucose, Bld: 127 mg/dL — ABNORMAL HIGH (ref 70–99)
Sodium: 139 mEq/L (ref 135–145)
Total Bilirubin: 0.5 mg/dL (ref 0.3–1.2)

## 2012-04-15 LAB — CBC WITH DIFFERENTIAL/PLATELET
Eosinophils Relative: 1 % (ref 0–5)
HCT: 34.3 % — ABNORMAL LOW (ref 36.0–46.0)
Lymphocytes Relative: 48 % — ABNORMAL HIGH (ref 12–46)
Lymphs Abs: 2.4 10*3/uL (ref 0.7–4.0)
MCV: 93.2 fL (ref 78.0–100.0)
Monocytes Absolute: 0.6 10*3/uL (ref 0.1–1.0)
Neutro Abs: 1.9 10*3/uL (ref 1.7–7.7)
Platelets: 244 10*3/uL (ref 150–400)
RBC: 3.68 MIL/uL — ABNORMAL LOW (ref 3.87–5.11)
WBC: 5 10*3/uL (ref 4.0–10.5)

## 2012-04-15 MED ORDER — MORPHINE SULFATE 4 MG/ML IJ SOLN
4.0000 mg | Freq: Once | INTRAMUSCULAR | Status: AC
  Administered 2012-04-15: 4 mg via INTRAVENOUS
  Filled 2012-04-15: qty 1

## 2012-04-15 MED ORDER — NITROGLYCERIN IN D5W 200-5 MCG/ML-% IV SOLN
5.0000 ug/min | Freq: Once | INTRAVENOUS | Status: AC
  Administered 2012-04-15: 5 ug/min via INTRAVENOUS

## 2012-04-15 MED ORDER — MORPHINE SULFATE 2 MG/ML IJ SOLN
1.0000 mg | INTRAMUSCULAR | Status: DC | PRN
  Administered 2012-04-15 – 2012-04-17 (×5): 1 mg via INTRAVENOUS
  Filled 2012-04-15 (×5): qty 1

## 2012-04-15 MED ORDER — ASPIRIN 81 MG PO CHEW
81.0000 mg | CHEWABLE_TABLET | Freq: Every day | ORAL | Status: DC
  Administered 2012-04-16: 81 mg via ORAL
  Filled 2012-04-15 (×2): qty 1

## 2012-04-15 MED ORDER — ENOXAPARIN SODIUM 30 MG/0.3ML ~~LOC~~ SOLN: 30.0000 mg | SUBCUTANEOUS | Status: DC

## 2012-04-15 MED ORDER — ASPIRIN 81 MG PO TABS: 81.0000 mg | ORAL_TABLET | Freq: Every day | ORAL | Status: DC

## 2012-04-15 MED ORDER — ENOXAPARIN SODIUM 40 MG/0.4ML ~~LOC~~ SOLN
40.0000 mg | SUBCUTANEOUS | Status: DC
  Administered 2012-04-16: 40 mg via SUBCUTANEOUS
  Filled 2012-04-15 (×2): qty 0.4

## 2012-04-15 MED ORDER — MORPHINE SULFATE 30 MG PO TABS
30.0000 mg | ORAL_TABLET | Freq: Three times a day (TID) | ORAL | Status: DC
  Administered 2012-04-16 – 2012-04-18 (×7): 30 mg via ORAL
  Filled 2012-04-15 (×7): qty 1

## 2012-04-15 MED ORDER — LISINOPRIL 20 MG PO TABS
20.0000 mg | ORAL_TABLET | Freq: Every day | ORAL | Status: DC
  Administered 2012-04-16 – 2012-04-18 (×3): 20 mg via ORAL
  Filled 2012-04-15 (×3): qty 1

## 2012-04-15 MED ORDER — NITROGLYCERIN IN D5W 200-5 MCG/ML-% IV SOLN
INTRAVENOUS | Status: AC
  Administered 2012-04-15: 5 ug/min via INTRAVENOUS
  Filled 2012-04-15: qty 250

## 2012-04-15 MED ORDER — PANTOPRAZOLE SODIUM 40 MG PO TBEC
40.0000 mg | DELAYED_RELEASE_TABLET | Freq: Every day | ORAL | Status: DC
Start: 2012-04-16 — End: 2012-04-18
  Administered 2012-04-16 – 2012-04-18 (×3): 40 mg via ORAL
  Filled 2012-04-15 (×3): qty 1

## 2012-04-15 MED ORDER — ASPIRIN 81 MG PO CHEW
324.0000 mg | CHEWABLE_TABLET | Freq: Once | ORAL | Status: AC
  Administered 2012-04-15: 324 mg via ORAL

## 2012-04-15 MED ORDER — ZOLPIDEM TARTRATE 10 MG PO TABS
10.0000 mg | ORAL_TABLET | Freq: Every evening | ORAL | Status: DC | PRN
  Administered 2012-04-16: 10 mg via ORAL
  Filled 2012-04-15: qty 1

## 2012-04-15 MED ORDER — ASPIRIN 81 MG PO CHEW
CHEWABLE_TABLET | ORAL | Status: AC
  Filled 2012-04-15: qty 4

## 2012-04-15 MED ORDER — HYDROCODONE-ACETAMINOPHEN 5-325 MG PO TABS: 1.0000 | ORAL_TABLET | ORAL | Status: DC | PRN

## 2012-04-15 MED ORDER — NITROGLYCERIN 5 MG/ML IV SOLN: 5.0000 ug/kg/min | INTRAVENOUS | Status: DC

## 2012-04-15 MED ORDER — FLUTICASONE PROPIONATE 50 MCG/ACT NA SUSP
2.0000 | Freq: Every day | NASAL | Status: DC
  Administered 2012-04-16 – 2012-04-18 (×2): 2 via NASAL
  Filled 2012-04-15 (×2): qty 16

## 2012-04-15 MED ORDER — ONDANSETRON HCL 4 MG PO TABS: 4.0000 mg | ORAL_TABLET | Freq: Four times a day (QID) | ORAL | Status: DC | PRN

## 2012-04-15 MED ORDER — POTASSIUM CHLORIDE CRYS ER 20 MEQ PO TBCR: 40.0000 meq | EXTENDED_RELEASE_TABLET | Freq: Once | ORAL | Status: DC

## 2012-04-15 MED ORDER — PREGABALIN 75 MG PO CAPS
75.0000 mg | ORAL_CAPSULE | Freq: Two times a day (BID) | ORAL | Status: DC
  Administered 2012-04-16 – 2012-04-18 (×5): 75 mg via ORAL
  Filled 2012-04-15 (×6): qty 1

## 2012-04-15 MED ORDER — SODIUM CHLORIDE 0.9 % IV SOLN
INTRAVENOUS | Status: DC
  Administered 2012-04-16: 50 mL/h via INTRAVENOUS

## 2012-04-15 MED ORDER — TIZANIDINE HCL 4 MG PO TABS
4.0000 mg | ORAL_TABLET | Freq: Four times a day (QID) | ORAL | Status: DC | PRN
  Filled 2012-04-15: qty 1

## 2012-04-15 MED ORDER — HYDROCHLOROTHIAZIDE 25 MG PO TABS
25.0000 mg | ORAL_TABLET | Freq: Every day | ORAL | Status: DC
  Filled 2012-04-15: qty 1

## 2012-04-15 MED ORDER — NITROGLYCERIN 0.4 MG SL SUBL
0.4000 mg | SUBLINGUAL_TABLET | SUBLINGUAL | Status: DC | PRN
  Administered 2012-04-16 – 2012-04-17 (×4): 0.4 mg via SUBLINGUAL

## 2012-04-15 MED ORDER — ONDANSETRON HCL 4 MG/2ML IJ SOLN: 4.0000 mg | Freq: Four times a day (QID) | INTRAMUSCULAR | Status: DC | PRN

## 2012-04-15 MED ORDER — SIMVASTATIN 20 MG PO TABS
20.0000 mg | ORAL_TABLET | Freq: Every evening | ORAL | Status: DC
  Administered 2012-04-16 – 2012-04-17 (×2): 20 mg via ORAL
  Filled 2012-04-15 (×3): qty 1

## 2012-04-15 MED ORDER — CARVEDILOL 3.125 MG PO TABS
3.1250 mg | ORAL_TABLET | Freq: Two times a day (BID) | ORAL | Status: DC
  Administered 2012-04-16 – 2012-04-18 (×4): 3.125 mg via ORAL
  Filled 2012-04-15 (×7): qty 1

## 2012-04-15 MED ORDER — SODIUM CHLORIDE 0.9 % IJ SOLN: 3.0000 mL | Freq: Two times a day (BID) | INTRAMUSCULAR | Status: DC

## 2012-04-15 NOTE — Progress Notes (Signed)
Rx:  Brief Lovenox note Wt= 95.7 kg  CrCl~80 ml/min Adjust Lovenox to 40mg  daily for DVT proph.  Lorenza Evangelist 04/15/2012 11:42 PM

## 2012-04-15 NOTE — ED Notes (Addendum)
Pt c/o heaviness in chest since this AM, states had hx of MI, "years ago." Pt reports taking 1 SL Nitro prior to arrival with no relief in chest pain. Pt reports SOB at present. 99% RA

## 2012-04-15 NOTE — H&P (Signed)
[facility] History and Physical  CHISTINE DEMATTEO DGU:[id number] DOB: [birth year] DOA: 04/15/2012  Referring physician: Hyacinth Meeker PCP: [name], MD   Chief Complaint: Chest pain  HPI:  Pt is 61 year old female with a history of dilated cardiomyopathy, chronic chest pain, hypertension and hypercholesterolemia who presents with sudden onset substernal chest pain, non radiating, intermittent in nature and 7/10 in severity, associated with nausea and worse with exertion. She reports similar episodes in the past. Denies fevers, chills, other abdominal or urinary concerns.   Review of Systems:   Constitutional: Negative for fever, chills and malaise/fatigue. Negative for diaphoresis.  HENT: Negative for hearing loss, ear pain, nosebleeds, congestion, sore throat, neck pain, tinnitus and ear discharge.   Eyes: Negative for blurred vision, double vision, photophobia, pain, discharge and redness.  Respiratory: Negative for cough, hemoptysis, sputum production, shortness of breath, wheezing and stridor.   Cardiovascular: Positive for chest pain, negative for palpitations, orthopnea, claudication and leg swelling.  Gastrointestinal: Negative for vomiting and abdominal pain. Negative for heartburn, constipation, blood in stool and melena.  Genitourinary: Negative for dysuria, urgency, frequency, hematuria and flank pain.  Musculoskeletal: Negative for myalgias, back pain, joint pain and falls.  Skin: Negative for itching and rash.  Neurological: Negative for dizziness and weakness. Negative for tingling, tremors, sensory change, speech change, focal weakness, loss of consciousness and headaches.  Endo/Heme/Allergies: Negative for environmental allergies and polydipsia. Does not bruise/bleed easily.  Psychiatric/Behavioral: Negative for suicidal ideas. The patient is not nervous/anxious.      Past Medical History  Diagnosis Date  . Dilated cardiomyopathy   . Chronic chest pain   .  HTN (hypertension)   . Hypercholesteremia   . Diverticulitis    Past Surgical History  Procedure Date  . Back surgery     x2  . Tummy tuck 2009  . Hand surgery     x2  . Hysterectomy - unknown type   . Cesarean section   . Shoulder surgery   . Kidney stone surgery    Social History:  reports that she quit smoking about 13 years ago. She does not have any smokeless tobacco history on file. She reports that she does not drink alcohol. Her drug history not on file.  No Known Allergies  Family History  Problem Relation Age of Onset  . Heart disease    . Cancer    . HIV    . Coronary artery disease    . Hypertension    . Diabetes      Prior to Admission medications   Medication Sig Start Date End Date Taking? Authorizing Provider  aspirin 81 MG tablet Take 81 mg by mouth daily.   Yes Historical Provider, MD  carvedilol (COREG) 3.125 MG tablet Take 3.125 mg by mouth 2 (two) times daily with a meal.   Yes Historical Provider, MD  fluticasone (FLONASE) 50 MCG/ACT nasal spray Place 2 sprays into the nose daily.   Yes Historical Provider, MD  hydrochlorothiazide (HYDRODIURIL) 25 MG tablet Take 25 mg by mouth daily.   Yes Historical Provider, MD  lisinopril (PRINIVIL,ZESTRIL) 20 MG tablet Take 20 mg by mouth daily.   Yes Historical Provider, MD  morphine (MSIR) 30 MG tablet Take 30 mg by mouth 3 (three) times daily.   Yes Historical Provider, MD  pantoprazole (PROTONIX) 40 MG tablet Take 40 mg by mouth daily.   Yes Historical Provider, MD  pregabalin (LYRICA) 75 MG capsule Take 75 mg by mouth 2 (two) times daily.  Yes Historical Provider, MD  simvastatin (ZOCOR) 20 MG tablet Take 20 mg by mouth every evening.   Yes Historical Provider, MD  tiZANidine (ZANAFLEX) 4 MG tablet Take 4 mg by mouth every 6 (six) hours as needed.   Yes Historical Provider, MD  nitroGLYCERIN (NITROSTAT) 0.4 MG SL tablet Place 0.4 mg under the tongue every 5 (five) minutes as needed.    Historical Provider, MD     Physical Exam: Filed Vitals:   04/15/12 2147 04/15/12 2202 04/15/12 2230  BP: 141/85 128/69 139/87  Pulse: 85 83 77  Temp: 98.8 F (37.1 C)    TempSrc: Oral    Resp: 16 20 18   Height: 5\' 2"  (1.575 m)    Weight: 95.709 kg (211 lb)    SpO2: 98% 98%    Physical Exam  Constitutional: Appears well-developed and well-nourished. No distress.  Mouth/Throat: Oropharynx is clear and moist.  Eyes: Conjunctivae and EOM are normal. Pupils are equal, round, and reactive to light. Right eye exhibits no discharge. Left eye exhibits no discharge. No scleral icterus.  Neck: Normal ROM. Neck supple. No JVD. No tracheal deviation. No thyromegaly.  Cardiovascular: Normal rate, regular rhythm, normal heart sounds and intact distal pulses.  Exam reveals no gallop and no friction rub. No murmur heard. Pulmonary/Chest: Effort normal and breath sounds normal. No stridor. No respiratory distress. No wheezes, no rales.  Abdominal: Soft. Bowel sounds are normal. No distension and no mass, no tenderness. No rebound and no guarding.  Musculoskeletal: Normal range of motion. No edema and no tenderness.  Lymphadenopathy: No lymphadenopathy noted, cervical, inguinal. Neurological: Alert. Normal reflexes. No cranial nerve deficit. Normal muscle tone. Coordination normal.  Skin: Skin is warm and dry. No rash noted. Not diaphoretic. No erythema. No pallor.  Psychiatric: Normal mood and affect. Behavior is normal. Judgment and thought content normal.   Labs on Admission:  Basic Metabolic Panel:  Lab 04/15/12 4098  NA 139  K 3.2*  CL 100  CO2 31  GLUCOSE 127*  BUN 14  CREATININE 0.76  CALCIUM 9.2  MG --  PHOS --   Liver Function Tests:  Lab 04/15/12 2151  AST 23  ALT 19  ALKPHOS 56  BILITOT 0.5  PROT 7.9  ALBUMIN 3.9   CBC:  Lab 04/15/12 2151  WBC 5.0  NEUTROABS 1.9  HGB 11.5*  HCT 34.3*  MCV 93.2  PLT 244    Radiological Exams on Admission: No results found.  EKG:  NSR  Assessment/Plan  Chest pain - will admit the pt to telemetry floor for further evaluation - will cycle CE's and obtain repeat 12 lead EKG - obtain TSH, lipid panel , A1C - control BP - provide supportive care with IVF, analgesia and antiemetics as needed, nitroglycerin for chest pain  HTN - continue home BP regimen  HLD - check lipid panel  Hypokalemia - will supplement  Code Status: Full Family Communication: Pt at bedside Disposition Plan: To telemetry floor   Debbora Presto, MD  [facility] Pager [phone]  If 7PM-7AM, please contact night-coverage [email or web address] Password [email or web address] 04/15/2012, 11:24 PM

## 2012-04-15 NOTE — ED Provider Notes (Signed)
History     CSN: [id number]  Arrival date & time 04/15/12  2145   First MD Initiated Contact with Patient 04/15/12 2203      Chief Complaint  Patient presents with  . Chest Pain    (Consider location/radiation/quality/duration/timing/severity/associated sxs/prior treatment) HPI Comments: 61 year old female with a history of dilated cardiomyopathy, chronic chest pain, hypertension and hypercholesterolemia. She presents with a complaint of chest pain. She states this started earlier in the day, felt like something sitting on her chest and has been persistent it was waxed and waned in intensity. Over the last several hours it has become worse, she took a nitroglycerin in route and had mild improvement but the pain has returned. She denies nausea or vomiting but has some shortness of breath. She denies swelling of her legs, travel, trauma, immobilization, hormone use, history of thromboembolism.  Patient is a 61 y.o. female presenting with chest pain. The history is provided by the patient and a relative.  Chest Pain     Past Medical History  Diagnosis Date  . Dilated cardiomyopathy   . Chronic chest pain   . HTN (hypertension)   . Hypercholesteremia   . Diverticulitis     Past Surgical History  Procedure Date  . Back surgery     x2  . Tummy tuck 2009  . Hand surgery     x2  . Hysterectomy - unknown type   . Cesarean section   . Shoulder surgery   . Kidney stone surgery     Family History  Problem Relation Age of Onset  . Heart disease    . Cancer    . HIV    . Coronary artery disease    . Hypertension    . Diabetes      History  Substance Use Topics  . Smoking status: Former Smoker    Quit date: 10/14/1998  . Smokeless tobacco: Not on file  . Alcohol Use: No    OB History    Grav Para Term Preterm Abortions TAB SAB Ect Mult Living                  Review of Systems  Cardiovascular: Positive for chest pain.  All other systems reviewed and are  negative.    Allergies  Review of patient's allergies indicates no known allergies.  Home Medications   Current Outpatient Rx  Name Route Sig Dispense Refill  . ASPIRIN 81 MG PO TABS Oral Take 81 mg by mouth daily.    Marland Kitchen CARVEDILOL 3.125 MG PO TABS Oral Take 3.125 mg by mouth 2 (two) times daily with a meal.    . FLUTICASONE PROPIONATE 50 MCG/ACT NA SUSP Nasal Place 2 sprays into the nose daily.    Marland Kitchen HYDROCHLOROTHIAZIDE 25 MG PO TABS Oral Take 25 mg by mouth daily.    Marland Kitchen LISINOPRIL 20 MG PO TABS Oral Take 20 mg by mouth daily.    . MORPHINE SULFATE 30 MG PO TABS Oral Take 30 mg by mouth 3 (three) times daily.    Marland Kitchen PANTOPRAZOLE SODIUM 40 MG PO TBEC Oral Take 40 mg by mouth daily.    Marland Kitchen PREGABALIN 75 MG PO CAPS Oral Take 75 mg by mouth 2 (two) times daily.    Marland Kitchen SIMVASTATIN 20 MG PO TABS Oral Take 20 mg by mouth every evening.    Marland Kitchen TIZANIDINE HCL 4 MG PO TABS Oral Take 4 mg by mouth every 6 (six) hours as needed.    Marland Kitchen  NITROGLYCERIN 0.4 MG SL SUBL Sublingual Place 0.4 mg under the tongue every 5 (five) minutes as needed.      BP 139/87  Pulse 77  Temp 98.8 F (37.1 C) (Oral)  Resp 18  Ht 5\' 2"  (1.575 m)  Wt 211 lb (95.709 kg)  BMI 38.59 kg/m2  SpO2 98%  Physical Exam  Nursing note and vitals reviewed. Constitutional: She appears well-developed and well-nourished.       Uncomfortable appearing  HENT:  Head: Normocephalic and atraumatic.  Mouth/Throat: Oropharynx is clear and moist. No oropharyngeal exudate.  Eyes: Conjunctivae and EOM are normal. Pupils are equal, round, and reactive to light. Right eye exhibits no discharge. Left eye exhibits no discharge. No scleral icterus.  Neck: Normal range of motion. Neck supple. No JVD present. No thyromegaly present.  Cardiovascular: Normal rate, regular rhythm, normal heart sounds and intact distal pulses.  Exam reveals no gallop and no friction rub.   No murmur heard. Pulmonary/Chest: Effort normal and breath sounds normal. No  respiratory distress. She has no wheezes. She has no rales.  Abdominal: Soft. Bowel sounds are normal. She exhibits no distension and no mass. There is no tenderness.  Musculoskeletal: Normal range of motion. She exhibits no edema and no tenderness.  Lymphadenopathy:    She has no cervical adenopathy.  Neurological: She is alert. Coordination normal.  Skin: Skin is warm and dry. No rash noted. No erythema.  Psychiatric: She has a normal mood and affect. Her behavior is normal.    ED Course  Procedures (including critical care time)  Labs Reviewed  CBC WITH DIFFERENTIAL - Abnormal; Notable for the following:    RBC 3.68 (*)     Hemoglobin 11.5 (*)     HCT 34.3 (*)     Neutrophils Relative 39 (*)     Lymphocytes Relative 48 (*)     All other components within normal limits  COMPREHENSIVE METABOLIC PANEL - Abnormal; Notable for the following:    Potassium 3.2 (*)     Glucose, Bld 127 (*)     GFR calc non Af Amer 89 (*)     All other components within normal limits  POCT I-STAT TROPONIN I   No results found.   1. Chest pain       MDM  EKG reveals mild Q waves in the inferior leads, no other signs of acute ischemia there are some nonspecific T waves in the lateral leads and precordial leads. Compared to prior EKG there are no significant changes. Will continue workup with troponin, labs, x-rays, aspirin has been given.  ED ECG REPORT  I personally interpreted this EKG   Date: 04/15/2012   Rate: 83  Rhythm: normal sinus rhythm  QRS Axis: normal  Intervals: normal  ST/T Wave abnormalities: nonspecific T wave changes  Conduction Disutrbances:none  Narrative Interpretation:   Old EKG Reviewed: unchanged  Pt has persistent chest pain, has nonspecific EKG changes and a normal troponin. Labs show slight hypokalemia but no other significant findings. Care was discussed with the [facility] hospitalist who agrees to admit the patient to the hospital for further chest pain evaluation and  rule out. Vital signs are stable at this time  Nitroglycerin given, aspirin given, morphine given.      Vida Roller, MD 04/15/12 [phone]

## 2012-04-16 ENCOUNTER — Inpatient Hospital Stay (HOSPITAL_COMMUNITY): Payer: Medicare Other

## 2012-04-16 ENCOUNTER — Encounter (HOSPITAL_COMMUNITY): Payer: Self-pay | Admitting: Cardiology

## 2012-04-16 DIAGNOSIS — E782 Mixed hyperlipidemia: Secondary | ICD-10-CM

## 2012-04-16 DIAGNOSIS — R072 Precordial pain: Secondary | ICD-10-CM

## 2012-04-16 DIAGNOSIS — I10 Essential (primary) hypertension: Secondary | ICD-10-CM

## 2012-04-16 DIAGNOSIS — I428 Other cardiomyopathies: Secondary | ICD-10-CM

## 2012-04-16 LAB — TSH: TSH: 3.142 u[IU]/mL (ref 0.350–4.500)

## 2012-04-16 LAB — HEMOGLOBIN A1C
Hgb A1c MFr Bld: 5.8 % — ABNORMAL HIGH
Mean Plasma Glucose: 120 mg/dL — ABNORMAL HIGH

## 2012-04-16 LAB — BASIC METABOLIC PANEL WITH GFR
BUN: 12 mg/dL (ref 6–23)
CO2: 31 meq/L (ref 19–32)
Calcium: 9 mg/dL (ref 8.4–10.5)
Chloride: 101 meq/L (ref 96–112)
Creatinine, Ser: 0.7 mg/dL (ref 0.50–1.10)
GFR calc Af Amer: >90 mL/min
GFR calc non Af Amer: >90 mL/min
Glucose, Bld: 122 mg/dL — ABNORMAL HIGH (ref 70–99)
Potassium: 2.9 meq/L — ABNORMAL LOW (ref 3.5–5.1)
Sodium: 140 meq/L (ref 135–145)

## 2012-04-16 LAB — LIPID PANEL
Cholesterol: 173 mg/dL (ref 0–200)
HDL: 63 mg/dL
LDL Cholesterol: 70 mg/dL (ref 0–99)
Total CHOL/HDL Ratio: 2.7 ratio
Triglycerides: 200 mg/dL — ABNORMAL HIGH
VLDL: 40 mg/dL (ref 0–40)

## 2012-04-16 LAB — CBC
HCT: 34.1 % — ABNORMAL LOW (ref 36.0–46.0)
Hemoglobin: 11.2 g/dL — ABNORMAL LOW (ref 12.0–15.0)
MCH: 31 pg (ref 26.0–34.0)
MCHC: 32.8 g/dL (ref 30.0–36.0)
MCV: 94.5 fL (ref 78.0–100.0)
Platelets: 204 10*3/uL (ref 150–400)
RBC: 3.61 MIL/uL — ABNORMAL LOW (ref 3.87–5.11)
RDW: 12.1 % (ref 11.5–15.5)
WBC: 3.6 10*3/uL — ABNORMAL LOW (ref 4.0–10.5)

## 2012-04-16 LAB — CARDIAC PANEL(CRET KIN+CKTOT+MB+TROPI)
CK, MB: 0.8 ng/mL (ref 0.3–4.0)
CK, MB: 0.8 ng/mL (ref 0.3–4.0)
CK, MB: 0.9 ng/mL (ref 0.3–4.0)
Relative Index: 0.6 (ref 0.0–2.5)
Total CK: 127 U/L (ref 7–177)
Total CK: 126 U/L (ref 7–177)
Troponin I: <0.3 ng/mL

## 2012-04-16 LAB — D-DIMER, QUANTITATIVE: D-Dimer, Quant: 0.6 ug/mL-FEU — ABNORMAL HIGH (ref 0.00–0.48)

## 2012-04-16 LAB — PRO B NATRIURETIC PEPTIDE: Pro B Natriuretic peptide (BNP): 21.2 pg/mL (ref 0–125)

## 2012-04-16 LAB — PHOSPHORUS: Phosphorus: 3.3 mg/dL (ref 2.3–4.6)

## 2012-04-16 LAB — MAGNESIUM: Magnesium: 1.7 mg/dL (ref 1.5–2.5)

## 2012-04-16 LAB — GLUCOSE, CAPILLARY: Glucose-Capillary: 128 mg/dL — ABNORMAL HIGH (ref 70–99)

## 2012-04-16 MED ORDER — IOHEXOL 350 MG/ML SOLN
100.0000 mL | Freq: Once | INTRAVENOUS | Status: AC | PRN
  Administered 2012-04-16: 100 mL via INTRAVENOUS

## 2012-04-16 MED ORDER — NITROGLYCERIN 0.4 MG SL SUBL
SUBLINGUAL_TABLET | SUBLINGUAL | Status: AC
  Administered 2012-04-16: 0.4 mg via SUBLINGUAL
  Filled 2012-04-16: qty 25

## 2012-04-16 MED ORDER — POTASSIUM CHLORIDE CRYS ER 20 MEQ PO TBCR
40.0000 meq | EXTENDED_RELEASE_TABLET | Freq: Two times a day (BID) | ORAL | Status: AC
  Administered 2012-04-16 (×2): 40 meq via ORAL
  Filled 2012-04-16 (×2): qty 2

## 2012-04-16 NOTE — Consult Note (Signed)
Primary cardiologist: Dr. Swaziland  Clinical Summary Tracy Buckley is a 61 y.o.female admitted to the hospital with recent progressive chest pain symptoms. She reports a recurring history of chest pain for many years, typically brief, although had a prolonged episode on the day of admission lasting for several hours. She notes no significant precipitant for this. Has baseline NYHA class II dyspnea on exertion. Cardiac markers have been negative so far. ECG shows sinus rhythm with nonspecific ST changes, early transition, small inferior Q waves.  Cardiac history is incomplete based on available information. She was previously seen by Dr. [name] with diagnosis of cardiomyopathy back in 2002, no obstructive CAD at that time. She states she had subsequent followup at [facility] while living in [place], has more recently seen Dr. Swaziland at some point within the last 2 years. It is not clear to me whether she has had any percutaneous interventions since 2002. Echocardiogram with [facility] back in 2010 demonstrated mild LVH with normal LV function, mild mitral and tricuspid regurgitation.  She reports compliance with her medications. No palpitations or syncope. Has occasional mild ankle edema. No orthopnea or PND.   No Known Allergies  Medications    . aspirin  324 mg Oral Once  . aspirin  81 mg Oral Daily  . carvedilol  3.125 mg Oral BID WC  . enoxaparin (LOVENOX) injection  40 mg Subcutaneous Q24H  . fluticasone  2 spray Each Nare Daily  . lisinopril  20 mg Oral Daily  . morphine  30 mg Oral TID  . morphine  4 mg Intravenous Once  . nitroGLYCERIN  5 mcg/min Intravenous Once  . pantoprazole  40 mg Oral Daily  . potassium chloride  40 mEq Oral Once  . potassium chloride  40 mEq Oral BID  . pregabalin  75 mg Oral BID  . simvastatin  20 mg Oral QPM  . sodium chloride  3 mL Intravenous Q12H  . DISCONTD: aspirin  81 mg Oral Daily  . DISCONTD: enoxaparin  30 mg Subcutaneous Q24H  .  DISCONTD: hydrochlorothiazide  25 mg Oral Daily    Past Medical History  Diagnosis Date  . Nonischemic cardiomyopathy      No obstructive disease at catheterization 2002 - Dr. [name]   . Chronic chest pain   . Essential hypertension, benign   . Hypercholesteremia   . Diverticulitis   . Depression     Admission for suicidal ideation 2001  . Polysubstance abuse      Remote history    Past Surgical History  Procedure Date  . Back surgery     x2  . Tummy tuck 2009  . Hand surgery     x2  . Hysterectomy - unknown type   . Cesarean section   . Shoulder surgery   . Kidney stone surgery     Family History  Problem Relation Age of Onset  . Heart disease    . Cancer    . HIV    . Coronary artery disease    . Hypertension    . Diabetes      Social History Ms. Cribb reports that she quit smoking about 13 years ago. Her smoking use included Cigarettes. She has never used smokeless tobacco. Ms. Reising reports that she does not drink alcohol.  Review of Systems No recent fevers or chills, no cough. Stable appetite. No reported bleeding problems. Otherwise negative except as outlined.  Physical Examination Blood pressure 124/84, pulse 85, temperature 98.2 F (36.8  C), temperature source Oral, resp. rate 18, height 5\' 2"  (1.575 m), weight 210 lb 1.6 oz (95.301 kg), SpO2 97.00%.  Overweight woman in no acute distress. HEENT: Conjunctiva and lids normal, oropharynx clear. Neck: Supple, no elevated JVP or carotid bruits, no thyromegaly. Lungs: Clear to auscultation, nonlabored breathing at rest. Cardiac: Regular rate and rhythm, no S3 or significant systolic murmur, no pericardial rub. Abdomen: Soft, nontender,bowel sounds present, no guarding or rebound. Extremities: No pitting edema, distal pulses 2+. Skin: Warm and dry. Musculoskeletal: No kyphosis. Neuropsychiatric: Alert and oriented x3, affect grossly appropriate.   Lab Results Basic Metabolic Panel:  Lab 04/16/12  0755 04/16/12 0030 04/15/12 2151  NA 140 -- 139  K 2.9* -- 3.2*  CL 101 -- 100  CO2 31 -- 31  GLUCOSE 122* -- 127*  BUN 12 -- 14  CREATININE 0.70 -- 0.76  CALCIUM 9.0 -- 9.2  MG -- 1.7 --  PHOS -- 3.3 --   Liver Function Tests:  Lab 04/15/12 2151  AST 23  ALT 19  ALKPHOS 56  BILITOT 0.5  PROT 7.9  ALBUMIN 3.9   CBC:  Lab 04/16/12 0755 04/15/12 2151  WBC 3.6* 5.0  NEUTROABS -- 1.9  HGB 11.2* 11.5*  HCT 34.1* 34.3*  MCV 94.5 93.2  PLT 204 244   Cardiac Enzymes:  Lab 04/16/12 0755 04/16/12 0030  CKTOTAL 127 129  CKMB 0.8 0.8  CKMBINDEX -- --  TROPONINI <0.30 <0.30   CBG:  Lab 04/16/12 0729  GLUCAP 128*    Impression   1. Presentation with chest pain, reportedly worse and more prolonged over her baseline recurring symptoms. ECG is nonspecific showing no acute ST segment abnormalities. Pro BNP is normal as are troponin levels. Patient's cardiac history includes a nonischemic cardiomyopathy with apparent normalization of LV function as of followup in 2010 with Dr. Swaziland. It is not clear that she has had any firm diagnosis of obstructive CAD along the way, although I do not have complete records. D-dimer is mildly elevated 0.6. Chest CT pending per hospitalist team.  2. Hypertension.  3. Hyperlipidemia.  4. Hypokalemia.  Recommendations  Present medications include aspirin, Coreg, Lovenox, lisinopril, PRN morphine, Zocor, potassium repletion. Would followup on echocardiogram already ordered for reassessment of LV function. If chest CT shows no acute findings, and LV function remains normal by followup echocardiogram, may consider a followup Myoview for assessment of ischemia. Will defer to Dr. Swaziland.   Jonelle Sidle, M.D., F.A.C.C.

## 2012-04-16 NOTE — Progress Notes (Signed)
Subjective: Complaining of chest pain which is intermittent, associated with shortness of breath  Objective: Vital signs in last 24 hours: Filed Vitals:   04/15/12 2330 04/16/12 0047 04/16/12 0131 04/16/12 0450  BP: 121/73  117/75 124/84  Pulse: 71 71 75 85  Temp:   98.4 F (36.9 C) 98.2 F (36.8 C)  TempSrc:   Oral Oral  Resp: 19 19  18   Height:   5\' 2"  (1.575 m)   Weight:   95.301 kg (210 lb 1.6 oz)   SpO2:   98% 97%   No intake or output data in the 24 hours ending 04/16/12 0836  Weight change:    Mouth/Throat: Oropharynx is clear and moist.  Eyes: Conjunctivae and EOM are normal. Pupils are equal, round, and reactive to light. Right eye exhibits no discharge. Left eye exhibits no discharge. No scleral icterus.  Neck: Normal ROM. Neck supple. No JVD. No tracheal deviation. No thyromegaly.  Cardiovascular: Normal rate, regular rhythm, normal heart sounds and intact distal pulses. Exam reveals no gallop and no friction rub. No murmur heard.  Pulmonary/Chest: Effort normal and breath sounds normal. No stridor. No respiratory distress. No wheezes, no rales.  Abdominal: Soft. Bowel sounds are normal. No distension and no mass, no tenderness. No rebound and no guarding.  Musculoskeletal: Normal range of motion. No edema and no tenderness.  Lymphadenopathy: No lymphadenopathy noted, cervical, inguinal. Neurological: Alert. Normal reflexes. No cranial nerve deficit. Normal muscle tone. Coordination normal.  Skin: Skin is warm and dry. No rash noted. Not diaphoretic. No erythema. No pallor.  Psychiatric: Normal mood and affect. Behavior is normal. Judgment and thought content normal.   Lab Results: Results for orders placed during the hospital encounter of 04/15/12 (from the past 24 hour(s))  CBC WITH DIFFERENTIAL     Status: Abnormal   Collection Time   04/15/12  9:51 PM      Component Value Range   WBC 5.0  4.0 - 10.5 K/uL   RBC 3.68 (*) 3.87 - 5.11 MIL/uL   Hemoglobin 11.5 (*)  12.0 - 15.0 g/dL   HCT 16.1 (*) 09.6 - 04.5 %   MCV 93.2  78.0 - 100.0 fL   MCH 31.3  26.0 - 34.0 pg   MCHC 33.5  30.0 - 36.0 g/dL   RDW 40.9  81.1 - 91.4 %   Platelets 244  150 - 400 K/uL   Neutrophils Relative 39 (*) 43 - 77 %   Neutro Abs 1.9  1.7 - 7.7 K/uL   Lymphocytes Relative 48 (*) 12 - 46 %   Lymphs Abs 2.4  0.7 - 4.0 K/uL   Monocytes Relative 12  3 - 12 %   Monocytes Absolute 0.6  0.1 - 1.0 K/uL   Eosinophils Relative 1  0 - 5 %   Eosinophils Absolute 0.1  0.0 - 0.7 K/uL   Basophils Relative 0  0 - 1 %   Basophils Absolute 0.0  0.0 - 0.1 K/uL  COMPREHENSIVE METABOLIC PANEL     Status: Abnormal   Collection Time   04/15/12  9:51 PM      Component Value Range   Sodium 139  135 - 145 mEq/L   Potassium 3.2 (*) 3.5 - 5.1 mEq/L   Chloride 100  96 - 112 mEq/L   CO2 31  19 - 32 mEq/L   Glucose, Bld 127 (*) 70 - 99 mg/dL   BUN 14  6 - 23 mg/dL   Creatinine, Ser 7.82  0.50 - 1.10 mg/dL   Calcium 9.2  8.4 - 16.1 mg/dL   Total Protein 7.9  6.0 - 8.3 g/dL   Albumin 3.9  3.5 - 5.2 g/dL   AST 23  0 - 37 U/L   ALT 19  0 - 35 U/L   Alkaline Phosphatase 56  39 - 117 U/L   Total Bilirubin 0.5  0.3 - 1.2 mg/dL   GFR calc non Af Amer 89 (*) >90 mL/min   GFR calc Af Amer >90  >90 mL/min  POCT I-STAT TROPONIN I     Status: Normal   Collection Time   04/15/12 10:07 PM      Component Value Range   Troponin i, poc 0.01  0.00 - 0.08 ng/mL   Comment 3           PHOSPHORUS     Status: Normal   Collection Time   04/16/12 12:30 AM      Component Value Range   Phosphorus 3.3  2.3 - 4.6 mg/dL  MAGNESIUM     Status: Normal   Collection Time   04/16/12 12:30 AM      Component Value Range   Magnesium 1.7  1.5 - 2.5 mg/dL  TSH     Status: Normal   Collection Time   04/16/12 12:30 AM      Component Value Range   TSH 3.142  0.350 - 4.500 uIU/mL  PRO B NATRIURETIC PEPTIDE     Status: Normal   Collection Time   04/16/12 12:30 AM      Component Value Range   Pro B Natriuretic peptide (BNP) 21.2  0  - 125 pg/mL  HEMOGLOBIN A1C     Status: Abnormal   Collection Time   04/16/12 12:30 AM      Component Value Range   Hemoglobin A1C 5.8 (*) <5.7 %   Mean Plasma Glucose 120 (*) <117 mg/dL  CARDIAC PANEL(CRET KIN+CKTOT+MB+TROPI)     Status: Normal   Collection Time   04/16/12 12:30 AM      Component Value Range   Total CK 129  7 - 177 U/L   CK, MB 0.8  0.3 - 4.0 ng/mL   Troponin I <0.30  <0.30 ng/mL   Relative Index 0.6  0.0 - 2.5  LIPID PANEL     Status: Abnormal   Collection Time   04/16/12 12:30 AM      Component Value Range   Cholesterol 173  0 - 200 mg/dL   Triglycerides 096 (*) <150 mg/dL   HDL 63  >04 mg/dL   Total CHOL/HDL Ratio 2.7     VLDL 40  0 - 40 mg/dL   LDL Cholesterol 70  0 - 99 mg/dL  GLUCOSE, CAPILLARY     Status: Abnormal   Collection Time   04/16/12  7:29 AM      Component Value Range   Glucose-Capillary 128 (*) 70 - 99 mg/dL  CARDIAC PANEL(CRET KIN+CKTOT+MB+TROPI)     Status: Normal   Collection Time   04/16/12  7:55 AM      Component Value Range   Total CK 127  7 - 177 U/L   CK, MB 0.8  0.3 - 4.0 ng/mL   Troponin I <0.30  <0.30 ng/mL   Relative Index 0.6  0.0 - 2.5  BASIC METABOLIC PANEL     Status: Abnormal   Collection Time   04/16/12  7:55 AM      Component Value Range  Sodium 140  135 - 145 mEq/L   Potassium 2.9 (*) 3.5 - 5.1 mEq/L   Chloride 101  96 - 112 mEq/L   CO2 31  19 - 32 mEq/L   Glucose, Bld 122 (*) 70 - 99 mg/dL   BUN 12  6 - 23 mg/dL   Creatinine, Ser 1.61  0.50 - 1.10 mg/dL   Calcium 9.0  8.4 - 09.6 mg/dL   GFR calc non Af Amer >90  >90 mL/min   GFR calc Af Amer >90  >90 mL/min  CBC     Status: Abnormal   Collection Time   04/16/12  7:55 AM      Component Value Range   WBC 3.6 (*) 4.0 - 10.5 K/uL   RBC 3.61 (*) 3.87 - 5.11 MIL/uL   Hemoglobin 11.2 (*) 12.0 - 15.0 g/dL   HCT 04.5 (*) 40.9 - 81.1 %   MCV 94.5  78.0 - 100.0 fL   MCH 31.0  26.0 - 34.0 pg   MCHC 32.8  30.0 - 36.0 g/dL   RDW 91.4  78.2 - 95.6 %   Platelets 204  150 -  400 K/uL     Micro: No results found for this or any previous visit (from the past 240 hour(s)).  Studies/Results: No results found.  Medications:  Scheduled Meds:   . aspirin  324 mg Oral Once  . aspirin  81 mg Oral Daily  . carvedilol  3.125 mg Oral BID WC  . enoxaparin (LOVENOX) injection  40 mg Subcutaneous Q24H  . fluticasone  2 spray Each Nare Daily  . lisinopril  20 mg Oral Daily  . morphine  30 mg Oral TID  . morphine  4 mg Intravenous Once  . nitroGLYCERIN  5 mcg/min Intravenous Once  . pantoprazole  40 mg Oral Daily  . potassium chloride  40 mEq Oral Once  . pregabalin  75 mg Oral BID  . simvastatin  20 mg Oral QPM  . sodium chloride  3 mL Intravenous Q12H  . DISCONTD: aspirin  81 mg Oral Daily  . DISCONTD: enoxaparin  30 mg Subcutaneous Q24H  . DISCONTD: hydrochlorothiazide  25 mg Oral Daily   Continuous Infusions:   . sodium chloride 50 mL/hr (04/16/12 0201)  . DISCONTD: nitroGLYCERIN Pediatric IV Infusion Stopped (04/15/12 2230)   PRN Meds:.morphine injection, nitroGLYCERIN, ondansetron (ZOFRAN) IV, ondansetron, tiZANidine, zolpidem, DISCONTD: HYDROcodone-acetaminophen   Assessment:   Plan: Chest pain  Cardiology has been consulted Cardiac enzymes negative thus far History of dilated cardiomyopathy therefore we'll order a 2-D echo Given shortness of breath we'll check a d-dimer, proBNP, continue to cycle cardiac enzymes Continue aspirin nitro paste and beta blocker   HTN - continue home BP regimen , discontinue HCTZ, his pro BNP elevated, we'll initiate Lasix  HLD  - check lipid panel  Hypokalemia  - will supplement    LOS: 1 day   Joyce [facility] 04/16/2012, 8:36 AM

## 2012-04-16 NOTE — Progress Notes (Signed)
  Echocardiogram 2D Echocardiogram has been performed.  Nicolaos Mitrano 04/16/2012, 9:33 AM

## 2012-04-17 ENCOUNTER — Encounter (HOSPITAL_COMMUNITY)
Admission: EM | Disposition: A | Discharge status: Home / Self Care | Payer: Self-pay | Source: Home / Self Care | Attending: Internal Medicine

## 2012-04-17 DIAGNOSIS — I2510 Atherosclerotic heart disease of native coronary artery without angina pectoris: Secondary | ICD-10-CM

## 2012-04-17 DIAGNOSIS — R079 Chest pain, unspecified: Secondary | ICD-10-CM

## 2012-04-17 HISTORY — PX: LEFT HEART CATHETERIZATION WITH CORONARY ANGIOGRAM: SHX5451

## 2012-04-17 LAB — BASIC METABOLIC PANEL
CO2: 29 mEq/L (ref 19–32)
Chloride: 100 mEq/L (ref 96–112)
Sodium: 137 mEq/L (ref 135–145)

## 2012-04-17 LAB — PROTIME-INR: Prothrombin Time: 12.7 seconds (ref 11.6–15.2)

## 2012-04-17 LAB — CARDIAC PANEL(CRET KIN+CKTOT+MB+TROPI): Relative Index: 1.1 (ref 0.0–2.5)

## 2012-04-17 LAB — GLUCOSE, CAPILLARY: Glucose-Capillary: 93 mg/dL (ref 70–99)

## 2012-04-17 SURGERY — LEFT HEART CATHETERIZATION WITH CORONARY ANGIOGRAM
Anesthesia: Moderate Sedation

## 2012-04-17 MED ORDER — SODIUM CHLORIDE 0.9 % IV SOLN: 1.0000 mL/kg/h | INTRAVENOUS | Status: DC

## 2012-04-17 MED ORDER — NITROGLYCERIN 0.2 MG/ML ON CALL CATH LAB
INTRAVENOUS | Status: AC
  Filled 2012-04-17: qty 1

## 2012-04-17 MED ORDER — HEPARIN (PORCINE) IN NACL 2-0.9 UNIT/ML-% IJ SOLN
INTRAMUSCULAR | Status: AC
  Filled 2012-04-17: qty 2000

## 2012-04-17 MED ORDER — ASPIRIN 81 MG PO CHEW
324.0000 mg | CHEWABLE_TABLET | ORAL | Status: DC
  Filled 2012-04-17: qty 4

## 2012-04-17 MED ORDER — SODIUM CHLORIDE 0.9 % IV SOLN: 250.0000 mL | INTRAVENOUS | Status: DC | PRN

## 2012-04-17 MED ORDER — HEPARIN SODIUM (PORCINE) 1000 UNIT/ML IJ SOLN
INTRAMUSCULAR | Status: AC
  Filled 2012-04-17: qty 1

## 2012-04-17 MED ORDER — ASPIRIN 81 MG PO CHEW
324.0000 mg | CHEWABLE_TABLET | ORAL | Status: AC
  Administered 2012-04-17: 324 mg via ORAL
  Filled 2012-04-17 (×2): qty 4

## 2012-04-17 MED ORDER — LIDOCAINE HCL (PF) 1 % IJ SOLN
INTRAMUSCULAR | Status: AC
  Filled 2012-04-17: qty 30

## 2012-04-17 MED ORDER — ACETAMINOPHEN 325 MG PO TABS: 650.0000 mg | ORAL_TABLET | ORAL | Status: DC | PRN

## 2012-04-17 MED ORDER — ASPIRIN 81 MG PO CHEW
81.0000 mg | CHEWABLE_TABLET | Freq: Every day | ORAL | Status: DC
  Administered 2012-04-18: 81 mg via ORAL
  Filled 2012-04-17 (×2): qty 1

## 2012-04-17 MED ORDER — MIDAZOLAM HCL 2 MG/2ML IJ SOLN
INTRAMUSCULAR | Status: AC
  Filled 2012-04-17: qty 2

## 2012-04-17 MED ORDER — MORPHINE SULFATE 4 MG/ML IJ SOLN
2.0000 mg | Freq: Once | INTRAMUSCULAR | Status: AC
  Administered 2012-04-17: 2 mg via INTRAVENOUS

## 2012-04-17 MED ORDER — SODIUM CHLORIDE 0.9 % IJ SOLN: 3.0000 mL | Freq: Two times a day (BID) | INTRAMUSCULAR | Status: DC

## 2012-04-17 MED ORDER — DIAZEPAM 5 MG PO TABS
5.0000 mg | ORAL_TABLET | ORAL | Status: AC
  Administered 2012-04-17: 5 mg via ORAL
  Filled 2012-04-17: qty 1

## 2012-04-17 MED ORDER — SODIUM CHLORIDE 0.9 % IV SOLN
INTRAVENOUS | Status: AC
  Administered 2012-04-17: 19:00:00 via INTRAVENOUS

## 2012-04-17 MED ORDER — MORPHINE SULFATE 4 MG/ML IJ SOLN
INTRAMUSCULAR | Status: AC
  Filled 2012-04-17: qty 1

## 2012-04-17 MED ORDER — FENTANYL CITRATE 0.05 MG/ML IJ SOLN
INTRAMUSCULAR | Status: AC
  Filled 2012-04-17: qty 2

## 2012-04-17 MED ORDER — VERAPAMIL HCL 2.5 MG/ML IV SOLN
INTRAVENOUS | Status: AC
  Filled 2012-04-17: qty 2

## 2012-04-17 MED ORDER — SODIUM CHLORIDE 0.9 % IJ SOLN: 3.0000 mL | INTRAMUSCULAR | Status: DC | PRN

## 2012-04-17 NOTE — Progress Notes (Signed)
Pt cc is chest pain. 8/10. Requested Morphine 1mg  and NTG 1 tab. Vital signs are within normal range (see in chart). I will monitor her for any changes in condition through remainder of shift.

## 2012-04-17 NOTE — H&P (View-Only) (Signed)
TELEMETRY: Reviewed telemetry pt in NSR: Filed Vitals:   04/16/12 1400 04/16/12 1411 04/16/12 2134 04/17/12 0447  BP: 104/72 107/74 123/81 94/62  Pulse: 78 73 69 68  Temp:  98.1 F (36.7 C) 97.9 F (36.6 C) 97.6 F (36.4 C)  TempSrc:   Oral Oral  Resp: 16 18 18 18   Height:      Weight:      SpO2: 97% 96% 96% 97%    Intake/Output Summary (Last 24 hours) at 04/17/12 0815 Last data filed at 04/17/12 0719  Gross per 24 hour  Intake 2439.17 ml  Output   1500 ml  Net 939.17 ml    SUBJECTIVE Complains of recurrent chest pain moderate in intensity. Gets relief with Morphine and sl NTG but pain recurs when medication wears off. Pain increasing in the past 3 days.  LABS: Basic Metabolic Panel:  Basename 04/17/12 0059 04/16/12 0755 04/16/12 0030  NA 137 140 --  K 3.1* 2.9* --  CL 100 101 --  CO2 29 31 --  GLUCOSE 135* 122* --  BUN 10 12 --  CREATININE 0.74 0.70 --  CALCIUM 8.7 9.0 --  MG -- -- 1.7  PHOS -- -- 3.3   Liver Function Tests:  [facility] 04/15/12 2151  AST 23  ALT 19  ALKPHOS 56  BILITOT 0.5  PROT 7.9  ALBUMIN 3.9   CBC:  Basename 04/16/12 0755 04/15/12 2151  WBC 3.6* 5.0  NEUTROABS -- 1.9  HGB 11.2* 11.5*  HCT 34.1* 34.3*  MCV 94.5 93.2  PLT 204 244   Cardiac Enzymes:  Basename 04/17/12 0059 04/16/12 1405 04/16/12 0755  CKTOTAL 109 126 127  CKMB 1.2 0.9 0.8  CKMBINDEX -- -- --  TROPONINI <0.30 <0.30 <0.30    D-Dimer:  Basename 04/16/12 1010  DDIMER 0.60*   Hemoglobin A1C:  Basename 04/16/12 0030  HGBA1C 5.8*   Fasting Lipid Panel:  Basename 04/16/12 0030  CHOL 173  HDL 63  LDLCALC 70  TRIG 200*  CHOLHDL 2.7  LDLDIRECT --   Thyroid Function Tests:  Basename 04/16/12 0030  TSH 3.142  T4TOTAL --  T3FREE --  THYROIDAB --   Anemia Panel: No results found for this basename: VITAMINB12,FOLATE,FERRITIN,TIBC,IRON,RETICCTPCT in the last 72 hours  Radiology/Studies:  Dg Chest 2 View  04/16/2012  *RADIOLOGY REPORT*   Clinical Data: Shortness of breath  CHEST - 2 VIEW  Comparison: 12/07/2009  Findings: Cardiomediastinal silhouette is stable.  No acute infiltrate or pleural effusion.  No pulmonary edema.  Mild degenerative changes lower thoracic spine.  IMPRESSION: .  No active disease.  Original Report Authenticated By: Natasha Mead, M.D.   Ct Angio Chest W/cm &/or Wo Cm  04/16/2012  *RADIOLOGY REPORT*  Clinical Data: Shortness of breath, elevated D-dimer, chest pain  CT ANGIOGRAPHY CHEST  Technique:  Multidetector CT imaging of the chest using the standard protocol during bolus administration of intravenous contrast. Multiplanar reconstructed images including MIPs were obtained and reviewed to evaluate the vascular anatomy.  Contrast: OMNIPAQUE IOHEXOL 350 MG/ML SOLN  Comparison: Chest x-ray of 04/16/2012  Findings: The pulmonary arteries opacify and there is no evidence of acute pulmonary embolism.  The thoracic aorta also opacifies with no acute abnormality.  The origins of the great vessels are patent.  No mediastinal or hilar adenopathy is seen.  The portion of the upper abdomen that is visualized is unremarkable.  On bone window images, there is mild to dependent bibasilar atelectasis present.  No focal infiltrate or effusion  is seen. There are diffuse degenerative changes throughout the thoracic spine.  IMPRESSION:  1.  No evidence of acute pulmonary embolism. 2.  Mild bibasilar dependent atelectasis.  No infiltrate or effusion.  Original Report Authenticated By: Juline Patch, M.D.    PHYSICAL EXAM General: Well developed, obese, in no acute distress. Head: Normocephalic, atraumatic, sclera non-icteric, no xanthomas, nares are without discharge. Neck: Negative for carotid bruits. JVD not elevated. Lungs: Clear bilaterally to auscultation without wheezes, rales, or rhonchi. Breathing is unlabored. Heart: RRR S1 S2 without murmurs, rubs, or gallops. No chest wall tenderness. Abdomen: Soft, non-tender,  non-distended with normoactive bowel sounds. No hepatomegaly. No rebound/guarding. No obvious abdominal masses. Msk:  Strength and tone appears normal for age. Extremities: No clubbing, cyanosis or edema.  Distal pedal pulses are 2+ and equal bilaterally. Neuro: Alert and oriented X 3. Moves all extremities spontaneously. Psych:  Responds to questions appropriately with a normal affect.  ASSESSMENT AND PLAN: 1. Chest pain refractory and recurrent. Cardiac enzymes and initial Ecg without acute findings. Repeat Ecg is pending. Last ischemic evaluation was apparently 11 years ago. Symptoms concerning for USAP.  2. HTN controlled.  3. Nonischemic cardiomyopathy in past.   4. HL  Plan: Given recurrent symptoms I think we need definitive evaluation today to rule out CAD. I recommend cardiac cath. I discussed with patient including alternative testing with stress testing. Procedure and risks of cardiac cath +/- PCI reviewed with patient and she is agreeable to proceed. Will add to cath schedule today. Keep NPO. Hold lovenox this am.  Active Problems:  Precordial pain  Nonischemic cardiomyopathy  Essential hypertension, benign  Mixed hyperlipidemia    Signed, Mayumi Summerson [place] MD,FACC 04/17/2012 8:22 AM

## 2012-04-17 NOTE — Progress Notes (Signed)
Gave pt 2nd dose of NTG 8/10/ pain stated.

## 2012-04-17 NOTE — Progress Notes (Signed)
Subjective: Complains of recurrent chest pain moderate in intensity. Gets relief with Morphine and sl NTG but pain recurs when medication wears off. Pain increasing in the past 3 days   Objective: Vital signs in last 24 hours: Filed Vitals:   04/16/12 1400 04/16/12 1411 04/16/12 2134 04/17/12 0447  BP: 104/72 107/74 123/81 94/62  Pulse: 78 73 69 68  Temp:  98.1 F (36.7 C) 97.9 F (36.6 C) 97.6 F (36.4 C)  TempSrc:   Oral Oral  Resp: 16 18 18 18   Height:      Weight:      SpO2: 97% 96% 96% 97%    Intake/Output Summary (Last 24 hours) at 04/17/12 0928 Last data filed at 04/17/12 0719  Gross per 24 hour  Intake 2439.17 ml  Output   1500 ml  Net 939.17 ml    Weight change:   General: Well developed, obese, in no acute distress.  Head: Normocephalic, atraumatic, sclera non-icteric, no xanthomas, nares are without discharge.  Neck: Negative for carotid bruits. JVD not elevated.  Lungs: Clear bilaterally to auscultation without wheezes, rales, or rhonchi. Breathing is unlabored.  Heart: RRR S1 S2 without murmurs, rubs, or gallops. No chest wall tenderness.  Abdomen: Soft, non-tender, non-distended with normoactive bowel sounds. No hepatomegaly. No rebound/guarding. No obvious abdominal masses.  Msk: Strength and tone appears normal for age.  Extremities: No clubbing, cyanosis or edema. Distal pedal pulses are 2+ and equal bilaterally.  Neuro: Alert and oriented X 3. Moves all extremities spontaneously.  Psych: Responds to questions appropriately with a normal affect.   Lab Results: Results for orders placed during the hospital encounter of 04/15/12 (from the past 24 hour(s))  D-DIMER, QUANTITATIVE     Status: Abnormal   Collection Time   04/16/12 10:10 AM      Component Value Range   D-Dimer, Quant 0.60 (*) 0.00 - 0.48 ug/mL-FEU  CARDIAC PANEL(CRET KIN+CKTOT+MB+TROPI)     Status: Normal   Collection Time   04/16/12  2:05 PM      Component Value Range   Total CK 126  7 -  177 U/L   CK, MB 0.9  0.3 - 4.0 ng/mL   Troponin I <0.30  <0.30 ng/mL   Relative Index 0.7  0.0 - 2.5  CARDIAC PANEL(CRET KIN+CKTOT+MB+TROPI)     Status: Normal   Collection Time   04/17/12 12:59 AM      Component Value Range   Total CK 109  7 - 177 U/L   CK, MB 1.2  0.3 - 4.0 ng/mL   Troponin I <0.30  <0.30 ng/mL   Relative Index 1.1  0.0 - 2.5  BASIC METABOLIC PANEL     Status: Abnormal   Collection Time   04/17/12 12:59 AM      Component Value Range   Sodium 137  135 - 145 mEq/L   Potassium 3.1 (*) 3.5 - 5.1 mEq/L   Chloride 100  96 - 112 mEq/L   CO2 29  19 - 32 mEq/L   Glucose, Bld 135 (*) 70 - 99 mg/dL   BUN 10  6 - 23 mg/dL   Creatinine, Ser 1.61  0.50 - 1.10 mg/dL   Calcium 8.7  8.4 - 09.6 mg/dL   GFR calc non Af Amer 90 (*) >90 mL/min   GFR calc Af Amer >90  >90 mL/min  GLUCOSE, CAPILLARY     Status: Normal   Collection Time   04/17/12  7:46 AM  Component Value Range   Glucose-Capillary 93  70 - 99 mg/dL     Micro: No results found for this or any previous visit (from the past 240 hour(s)).  Studies/Results: Dg Chest 2 View  04/16/2012  *RADIOLOGY REPORT*  Clinical Data: Shortness of breath  CHEST - 2 VIEW  Comparison: 12/07/2009  Findings: Cardiomediastinal silhouette is stable.  No acute infiltrate or pleural effusion.  No pulmonary edema.  Mild degenerative changes lower thoracic spine.  IMPRESSION: .  No active disease.  Original Report Authenticated By: Natasha Mead, M.D.   Ct Angio Chest W/cm &/or Wo Cm  04/16/2012  *RADIOLOGY REPORT*  Clinical Data: Shortness of breath, elevated D-dimer, chest pain  CT ANGIOGRAPHY CHEST  Technique:  Multidetector CT imaging of the chest using the standard protocol during bolus administration of intravenous contrast. Multiplanar reconstructed images including MIPs were obtained and reviewed to evaluate the vascular anatomy.  Contrast: OMNIPAQUE IOHEXOL 350 MG/ML SOLN  Comparison: Chest x-ray of 04/16/2012  Findings: The  pulmonary arteries opacify and there is no evidence of acute pulmonary embolism.  The thoracic aorta also opacifies with no acute abnormality.  The origins of the great vessels are patent.  No mediastinal or hilar adenopathy is seen.  The portion of the upper abdomen that is visualized is unremarkable.  On bone window images, there is mild to dependent bibasilar atelectasis present.  No focal infiltrate or effusion is seen. There are diffuse degenerative changes throughout the thoracic spine.  IMPRESSION:  1.  No evidence of acute pulmonary embolism. 2.  Mild bibasilar dependent atelectasis.  No infiltrate or effusion.  Original Report Authenticated By: Juline Patch, M.D.    Medications:  Scheduled Meds:   . aspirin  324 mg Oral Pre-Cath  . aspirin  81 mg Oral Daily  . carvedilol  3.125 mg Oral BID WC  . diazepam  5 mg Oral On Call  . fluticasone  2 spray Each Nare Daily  . lisinopril  20 mg Oral Daily  . morphine  30 mg Oral TID  . pantoprazole  40 mg Oral Daily  . potassium chloride  40 mEq Oral Once  . potassium chloride  40 mEq Oral BID  . pregabalin  75 mg Oral BID  . simvastatin  20 mg Oral QPM  . sodium chloride  3 mL Intravenous Q12H  . sodium chloride  3 mL Intravenous Q12H  . DISCONTD: aspirin  324 mg Oral Pre-Cath  . DISCONTD: aspirin  81 mg Oral Daily  . DISCONTD: enoxaparin (LOVENOX) injection  40 mg Subcutaneous Q24H   Continuous Infusions:   . sodium chloride 50 mL/hr at 04/17/12 0600  . sodium chloride 1 mL/kg/hr (04/17/12 0900)  . DISCONTD: sodium chloride     PRN Meds:.sodium chloride, iohexol, morphine injection, nitroGLYCERIN, ondansetron (ZOFRAN) IV, ondansetron, sodium chloride, tiZANidine, zolpidem   Assessment: Active Problems:  Precordial pain  Nonischemic cardiomyopathy  Essential hypertension, benign  Mixed hyperlipidemia   Plan: 1. Chest pain refractory and recurrent. Cardiac enzymes and initial Ecg without acute findings. Repeat Ecg is pending.  Last ischemic evaluation was apparently 11 years ago. Symptoms concerning for unstable angina 2. HTN controlled.  3. Nonischemic cardiomyopathy in past.  4. HL  Plan: Given recurrent symptoms cardiology recommends cardiac cath. I discussed with patient including alternative testing with stress testing. Procedure and risks of cardiac cath +/- PCI reviewed with patient and she is agreeable to proceed. Will add to cath schedule today. Keep NPO. Hold lovenox this  am.    LOS: 2 days   [facility] 04/17/2012, 9:28 AM

## 2012-04-17 NOTE — Progress Notes (Signed)
Performed EKG with 2nd dose of NTG. No changes since EKG taken on 04/16/2012 (placed in chart). Pt is relaxing and states that pain is subsiding. I will continue to monitor her during shift.

## 2012-04-17 NOTE — Interval H&P Note (Signed)
History and Physical Interval Note:  04/17/2012 2:31 PM  Tracy Buckley  has presented today for surgery, with the diagnosis of chest pain  The various methods of treatment have been discussed with the patient and family. After consideration of risks, benefits and other options for treatment, the patient has consented to  Procedure(s) (LRB): LEFT HEART CATHETERIZATION WITH CORONARY ANGIOGRAM (N/A) as a surgical intervention .  The patient's history has been reviewed, patient examined, no change in status, stable for surgery.  I have reviewed the patients' chart and labs.  Questions were answered to the patient's satisfaction.     Brenten Janney

## 2012-04-17 NOTE — CV Procedure (Signed)
    Cardiac Catheterization Operative Report  Tracy Buckley [id number] 7/5/20133:20 PM [name], MD  Procedure Performed:  1. Left Heart Catheterization 2. Selective Coronary Angiography 3. Left ventricular angiogram  Operator: Verne Carrow, MD  Arterial access site:  Right radial artery.   Indication:   Chest pain concerning for unstable angina, known CAD, negative cardiac enzymes.                                     Procedure Details: The risks, benefits, complications, treatment options, and expected outcomes were discussed with the patient. The patient and/or family concurred with the proposed plan, giving informed consent. She was awake and conversant and exhibited an understanding of the procedure. The patient was brought to the cath lab after IV hydration was begun and oral premedication was given. The patient was further sedated with Versed and Fentanyl. The right wrist was assessed with an Allens test which was positive. The right wrist was prepped and draped in a sterile fashion. 1% lidocaine was used for local anesthesia. Using the modified Seldinger access technique, a 5 French sheath was placed in the right radial artery. 3 mg Verapamil was given through the sheath. 5000 units IV heparin was given. A JR 4 catheter was used to engage the RCA. A EBU 3.0 guide was used to engage the left main.  A pigtail catheter was used to perform a left ventricular angiogram. The sheath was removed from the right radial artery and a Terumo hemostasis band was applied at the arteriotomy site on the right wrist.   There were no immediate complications. The patient was taken to the recovery area in stable condition.   Hemodynamic Findings: Central aortic pressure: 141/83 Left ventricular pressure: 138/17/31  Angiographic Findings:  Left main: No obstructive disease.  Left Anterior Descending Artery: Large caliber vessel that courses to the apex. There are mild luminal  irregularities but no focal stenosis.   Circumflex Artery: Moderate sized vessel that gives off three small to moderate sized marginal branches that are free of disease. There is a small caliber intermediate branch that appears to have 50% stenosis at the ostium. (This vessel is 1.5 mm.)   Right Coronary Artery: Small to moderate sized dominant vessel with mild luminal irregularities but no focal stenosis.   Left Ventricular Angiogram: LVEF=50%.  Impression: 1. Mild, non-obstructive CAD 2. Low normal LV systolic function 3. Non-cardiac chest pain  Recommendations: Would manage her non-obstructive CAD with ASA 81 mg po QDaily, statin. Ok to d/c from cardiac standpoint. She can f/u with Dr. [place] in our [facility] office in 3-4 weeks.        Complications:  None. The patient tolerated the procedure well.

## 2012-04-17 NOTE — Progress Notes (Signed)
 TELEMETRY: Reviewed telemetry pt in NSR: Filed Vitals:   04/16/12 1400 04/16/12 1411 04/16/12 2134 04/17/12 0447  BP: 104/72 107/74 123/81 94/62  Pulse: 78 73 69 68  Temp:  98.1 F (36.7 C) 97.9 F (36.6 C) 97.6 F (36.4 C)  TempSrc:   Oral Oral  Resp: 16 18 18 18  Height:      Weight:      SpO2: 97% 96% 96% 97%    Intake/Output Summary (Last 24 hours) at 04/17/12 0815 Last data filed at 04/17/12 0719  Gross per 24 hour  Intake 2439.17 ml  Output   1500 ml  Net 939.17 ml    SUBJECTIVE Complains of recurrent chest pain moderate in intensity. Gets relief with Morphine and sl NTG but pain recurs when medication wears off. Pain increasing in the past 3 days.  LABS: Basic Metabolic Panel:  Basename 04/17/12 0059 04/16/12 0755 04/16/12 0030  NA 137 140 --  K 3.1* 2.9* --  CL 100 101 --  CO2 29 31 --  GLUCOSE 135* 122* --  BUN 10 12 --  CREATININE 0.74 0.70 --  CALCIUM 8.7 9.0 --  MG -- -- 1.7  PHOS -- -- 3.3   Liver Function Tests:  Basename 04/15/12 2151  AST 23  ALT 19  ALKPHOS 56  BILITOT 0.5  PROT 7.9  ALBUMIN 3.9   CBC:  Basename 04/16/12 0755 04/15/12 2151  WBC 3.6* 5.0  NEUTROABS -- 1.9  HGB 11.2* 11.5*  HCT 34.1* 34.3*  MCV 94.5 93.2  PLT 204 244   Cardiac Enzymes:  Basename 04/17/12 0059 04/16/12 1405 04/16/12 0755  CKTOTAL 109 126 127  CKMB 1.2 0.9 0.8  CKMBINDEX -- -- --  TROPONINI <0.30 <0.30 <0.30    D-Dimer:  Basename 04/16/12 1010  DDIMER 0.60*   Hemoglobin A1C:  Basename 04/16/12 0030  HGBA1C 5.8*   Fasting Lipid Panel:  Basename 04/16/12 0030  CHOL 173  HDL 63  LDLCALC 70  TRIG 200*  CHOLHDL 2.7  LDLDIRECT --   Thyroid Function Tests:  Basename 04/16/12 0030  TSH 3.142  T4TOTAL --  T3FREE --  THYROIDAB --   Anemia Panel: No results found for this basename: VITAMINB12,FOLATE,FERRITIN,TIBC,IRON,RETICCTPCT in the last 72 hours  Radiology/Studies:  Dg Chest 2 View  04/16/2012  *RADIOLOGY REPORT*   Clinical Data: Shortness of breath  CHEST - 2 VIEW  Comparison: 12/07/2009  Findings: Cardiomediastinal silhouette is stable.  No acute infiltrate or pleural effusion.  No pulmonary edema.  Mild degenerative changes lower thoracic spine.  IMPRESSION: .  No active disease.  Original Report Authenticated By: LIVIU POP, M.D.   Ct Angio Chest W/cm &/or Wo Cm  04/16/2012  *RADIOLOGY REPORT*  Clinical Data: Shortness of breath, elevated D-dimer, chest pain  CT ANGIOGRAPHY CHEST  Technique:  Multidetector CT imaging of the chest using the standard protocol during bolus administration of intravenous contrast. Multiplanar reconstructed images including MIPs were obtained and reviewed to evaluate the vascular anatomy.  Contrast: 100mL OMNIPAQUE IOHEXOL 350 MG/ML SOLN  Comparison: Chest x-ray of 04/16/2012  Findings: The pulmonary arteries opacify and there is no evidence of acute pulmonary embolism.  The thoracic aorta also opacifies with no acute abnormality.  The origins of the great vessels are patent.  No mediastinal or hilar adenopathy is seen.  The portion of the upper abdomen that is visualized is unremarkable.  On bone window images, there is mild to dependent bibasilar atelectasis present.  No focal infiltrate or effusion   is seen. There are diffuse degenerative changes throughout the thoracic spine.  IMPRESSION:  1.  No evidence of acute pulmonary embolism. 2.  Mild bibasilar dependent atelectasis.  No infiltrate or effusion.  Original Report Authenticated By: PAUL D. BARRY, M.D.    PHYSICAL EXAM General: Well developed, obese, in no acute distress. Head: Normocephalic, atraumatic, sclera non-icteric, no xanthomas, nares are without discharge. Neck: Negative for carotid bruits. JVD not elevated. Lungs: Clear bilaterally to auscultation without wheezes, rales, or rhonchi. Breathing is unlabored. Heart: RRR S1 S2 without murmurs, rubs, or gallops. No chest wall tenderness. Abdomen: Soft, non-tender,  non-distended with normoactive bowel sounds. No hepatomegaly. No rebound/guarding. No obvious abdominal masses. Msk:  Strength and tone appears normal for age. Extremities: No clubbing, cyanosis or edema.  Distal pedal pulses are 2+ and equal bilaterally. Neuro: Alert and oriented X 3. Moves all extremities spontaneously. Psych:  Responds to questions appropriately with a normal affect.  ASSESSMENT AND PLAN: 1. Chest pain refractory and recurrent. Cardiac enzymes and initial Ecg without acute findings. Repeat Ecg is pending. Last ischemic evaluation was apparently 11 years ago. Symptoms concerning for USAP.  2. HTN controlled.  3. Nonischemic cardiomyopathy in past.   4. HL  Plan: Given recurrent symptoms I think we need definitive evaluation today to rule out CAD. I recommend cardiac cath. I discussed with patient including alternative testing with stress testing. Procedure and risks of cardiac cath +/- PCI reviewed with patient and she is agreeable to proceed. Will add to cath schedule today. Keep NPO. Hold lovenox this am.  Active Problems:  Precordial pain  Nonischemic cardiomyopathy  Essential hypertension, benign  Mixed hyperlipidemia    Signed, Johne Buckle MD,FACC 04/17/2012 8:22 AM    

## 2012-04-18 MED ORDER — POTASSIUM CHLORIDE CRYS ER 20 MEQ PO TBCR
40.0000 meq | EXTENDED_RELEASE_TABLET | Freq: Once | ORAL | Status: AC
  Administered 2012-04-18: 40 meq via ORAL
  Filled 2012-04-18: qty 2

## 2012-04-18 NOTE — Progress Notes (Signed)
Patient discharged to home. DC instructions given. No concerns voiced. Left unit in wheelchair pushed by nurse tech. Left in good condition.

## 2012-04-18 NOTE — Progress Notes (Addendum)
Subjective:  Waking up. Tired, but feeling well. No CP. Cath side in right radial feels well.   Objective:  Vital Signs in the last 24 hours: Temp:  [97.8 F (36.6 C)-98 F (36.7 C)] 97.8 F (36.6 C) (07/06 0556) Pulse Rate:  [62-73] 73  (07/06 0556) Resp:  [18-19] 19  (07/06 0556) BP: (102-145)/(67-83) 102/67 mmHg (07/06 0556) SpO2:  [96 %-98 %] 96 % (07/06 0556) Weight:  [96.843 kg (213 lb 8 oz)] 96.843 kg (213 lb 8 oz) (07/05 1048)  Intake/Output from previous day: 07/05 0701 - 07/06 0700 In: 1262.5 [I.V.:1262.5] Out: 1250 [Urine:1250]   Physical Exam: General: Well developed, well nourished, in no acute distress. Obese Head:  Normocephalic and atraumatic. Lungs: Clear to auscultation and percussion. Heart: Normal S1 and S2.  No murmur, rubs or gallops.  Abdomen: soft, non-tender, positive bowel sounds. Extremities: No clubbing or cyanosis. No edema. Normal radial pulse at cath site.  Neurologic: Alert and oriented x 3.    Lab Results:  Basename 04/16/12 0755 04/15/12 2151  WBC 3.6* 5.0  HGB 11.2* 11.5*  PLT 204 244    Basename 04/17/12 0059 04/16/12 0755  NA 137 140  K 3.1* 2.9*  CL 100 101  CO2 29 31  GLUCOSE 135* 122*  BUN 10 12  CREATININE 0.74 0.70    Basename 04/17/12 0059 04/16/12 1405  TROPONINI <0.30 <0.30   Hepatic Function Panel  Basename 04/15/12 2151  PROT 7.9  ALBUMIN 3.9  AST 23  ALT 19  ALKPHOS 56  BILITOT 0.5  BILIDIR --  IBILI --    Basename 04/16/12 0030  CHOL 173   No results found for this basename: PROTIME in the last 72 hours  Imaging: Dg Chest 2 View  04/16/2012  *RADIOLOGY REPORT*  Clinical Data: Shortness of breath  CHEST - 2 VIEW  Comparison: 12/07/2009  Findings: Cardiomediastinal silhouette is stable.  No acute infiltrate or pleural effusion.  No pulmonary edema.  Mild degenerative changes lower thoracic spine.  IMPRESSION: .  No active disease.  Original Report Authenticated By: Natasha Mead, M.D.   Ct Angio Chest  W/cm &/or Wo Cm  04/16/2012  *RADIOLOGY REPORT*  Clinical Data: Shortness of breath, elevated D-dimer, chest pain  CT ANGIOGRAPHY CHEST  Technique:  Multidetector CT imaging of the chest using the standard protocol during bolus administration of intravenous contrast. Multiplanar reconstructed images including MIPs were obtained and reviewed to evaluate the vascular anatomy.  Contrast: OMNIPAQUE IOHEXOL 350 MG/ML SOLN  Comparison: Chest x-ray of 04/16/2012  Findings: The pulmonary arteries opacify and there is no evidence of acute pulmonary embolism.  The thoracic aorta also opacifies with no acute abnormality.  The origins of the great vessels are patent.  No mediastinal or hilar adenopathy is seen.  The portion of the upper abdomen that is visualized is unremarkable.  On bone window images, there is mild to dependent bibasilar atelectasis present.  No focal infiltrate or effusion is seen. There are diffuse degenerative changes throughout the thoracic spine.  IMPRESSION:  1.  No evidence of acute pulmonary embolism. 2.  Mild bibasilar dependent atelectasis.  No infiltrate or effusion.  Original Report Authenticated By: Juline Patch, M.D.    Telemetry: No adverse rhythms Personally viewed.   EKG:  NSR, possible old inferior infarct pattern (small likely non pathologic Q waves in F.)  Cardiac Studies:  Cath: non obstructive CAD. 50% ramus at ostium, small caliber, low normal EF 50%.   Assessment/Plan:  Active  Problems:  Precordial pain  Nonischemic cardiomyopathy  Essential hypertension, benign  Mixed hyperlipidemia  Chest pain -  Reassuring heart cath. Continue with prevention, weight loss, lipid control, BP control as outpt.  She may follow up with Dr. Swaziland in 3-4 weeks.   Obestiy  - encouraged weight loss, diet modification  HTN  - mildly elevated yesterday, currently normal 102/67, no changes in meds.   HL  - simvastatin.   Hypokalemia  - supplement.   OK to DC from CV  standpoint.   Yarisbel Miranda 04/18/2012, 8:05 AM

## 2012-04-18 NOTE — Discharge Summary (Signed)
Physician Discharge Summary  Tracy Buckley MRN: [id number] DOB/AGE: 1951-09-30 61 y.o.  PCP: [name], MD   Admit date: 04/15/2012 Discharge date: 04/18/2012   Active Problems:  Precordial pain  Nonischemic cardiomyopathy  Essential hypertension, benign  Mixed hyperlipidemia   Medication List  As of 04/18/2012  9:13 AM   TAKE these medications         aspirin 81 MG tablet   Take 81 mg by mouth daily.      carvedilol 3.125 MG tablet   Commonly known as: COREG   Take 3.125 mg by mouth 2 (two) times daily with a meal.      fluticasone 50 MCG/ACT nasal spray   Commonly known as: FLONASE   Place 2 sprays into the nose daily.      hydrochlorothiazide 25 MG tablet   Commonly known as: HYDRODIURIL   Take 25 mg by mouth daily.      lisinopril 20 MG tablet   Commonly known as: PRINIVIL,ZESTRIL   Take 20 mg by mouth daily.      morphine 30 MG tablet   Commonly known as: MSIR   Take 30 mg by mouth 3 (three) times daily.      nitroGLYCERIN 0.4 MG SL tablet   Commonly known as: NITROSTAT   Place 0.4 mg under the tongue every 5 (five) minutes as needed.      pantoprazole 40 MG tablet   Commonly known as: PROTONIX   Take 40 mg by mouth daily.      pregabalin 75 MG capsule   Commonly known as: LYRICA   Take 75 mg by mouth 2 (two) times daily.      simvastatin 20 MG tablet   Commonly known as: ZOCOR   Take 20 mg by mouth every evening.      tiZANidine 4 MG tablet   Commonly known as: ZANAFLEX   Take 4 mg by mouth every 6 (six) hours as needed.            Discharge Condition: Stable   Disposition: 01-Home or Self Care   Consults:  #1 cardiology consultation  Significant Diagnostic Studies: Dg Chest 2 View  04/16/2012  *RADIOLOGY REPORT*  Clinical Data: Shortness of breath  CHEST - 2 VIEW  Comparison: 12/07/2009  Findings: Cardiomediastinal silhouette is stable.  No acute infiltrate or pleural effusion.  No pulmonary edema.  Mild degenerative  changes lower thoracic spine.  IMPRESSION: .  No active disease.  Original Report Authenticated By: Natasha Mead, M.D.   Ct Angio Chest W/cm &/or Wo Cm  04/16/2012  *RADIOLOGY REPORT*  Clinical Data: Shortness of breath, elevated D-dimer, chest pain  CT ANGIOGRAPHY CHEST  Technique:  Multidetector CT imaging of the chest using the standard protocol during bolus administration of intravenous contrast. Multiplanar reconstructed images including MIPs were obtained and reviewed to evaluate the vascular anatomy.  Contrast: OMNIPAQUE IOHEXOL 350 MG/ML SOLN  Comparison: Chest x-ray of 04/16/2012  Findings: The pulmonary arteries opacify and there is no evidence of acute pulmonary embolism.  The thoracic aorta also opacifies with no acute abnormality.  The origins of the great vessels are patent.  No mediastinal or hilar adenopathy is seen.  The portion of the upper abdomen that is visualized is unremarkable.  On bone window images, there is mild to dependent bibasilar atelectasis present.  No focal infiltrate or effusion is seen. There are diffuse degenerative changes throughout the thoracic spine.  IMPRESSION:  1.  No evidence  of acute pulmonary embolism. 2.  Mild bibasilar dependent atelectasis.  No infiltrate or effusion.  Original Report Authenticated By: Juline Patch, M.D.   Cardiac catheterization Left Ventricular Angiogram: LVEF=50%.  Impression:  1. Mild, non-obstructive CAD  2. Low normal LV systolic function  3. Non-cardiac chest pain  Recommendations: Would manage her non-obstructive CAD with ASA 81 mg po QDaily, statin. Ok to d/c from cardiac standpoint. She can f/u with Dr. Swaziland in our [facility] office in 3-4 weeks.   2-D echo LV EF: 50% - 55%  ------------------------------------------------------------ Indications: Chest pain 786.51.  ------------------------------------------------------------ History: Risk factors: Hypertension.  Dyslipidemia.  ------------------------------------------------------------ Study Conclusions  - Left ventricle: The cavity size was normal. Wall thickness was increased in a pattern of mild LVH. Systolic function was normal. The estimated ejection fraction was in the range of 50% to 55%. Wall motion was normal; there were no regional wall motion abnormalities. Doppler parameters are consistent with abnormal left ventricular relaxation (grade 1 diastolic dysfunction). - Mitral valve: Mild regurgitation. - Pulmonary arteries: Systolic pressure was mildly increased. PA peak pressure: 41mm Hg (S).     Microbiology: No results found for this or any previous visit (from the past 240 hour(s)).   Labs: Results for orders placed during the hospital encounter of 04/15/12 (from the past 48 hour(s))  D-DIMER, QUANTITATIVE     Status: Abnormal   Collection Time   04/16/12 10:10 AM      Component Value Range Comment   D-Dimer, Quant 0.60 (*) 0.00 - 0.48 ug/mL-FEU   CARDIAC PANEL(CRET KIN+CKTOT+MB+TROPI)     Status: Normal   Collection Time   04/16/12  2:05 PM      Component Value Range Comment   Total CK 126  7 - 177 U/L    CK, MB 0.9  0.3 - 4.0 ng/mL    Troponin I <0.30  <0.30 ng/mL    Relative Index 0.7  0.0 - 2.5   CARDIAC PANEL(CRET KIN+CKTOT+MB+TROPI)     Status: Normal   Collection Time   04/17/12 12:59 AM      Component Value Range Comment   Total CK 109  7 - 177 U/L    CK, MB 1.2  0.3 - 4.0 ng/mL    Troponin I <0.30  <0.30 ng/mL    Relative Index 1.1  0.0 - 2.5   BASIC METABOLIC PANEL     Status: Abnormal   Collection Time   04/17/12 12:59 AM      Component Value Range Comment   Sodium 137  135 - 145 mEq/L    Potassium 3.1 (*) 3.5 - 5.1 mEq/L    Chloride 100  96 - 112 mEq/L    CO2 29  19 - 32 mEq/L    Glucose, Bld 135 (*) 70 - 99 mg/dL    BUN 10  6 - 23 mg/dL    Creatinine, Ser 1.61  0.50 - 1.10 mg/dL    Calcium 8.7  8.4 - 09.6 mg/dL    GFR calc non Af Amer 90 (*) >90  mL/min    GFR calc Af Amer >90  >90 mL/min   GLUCOSE, CAPILLARY     Status: Normal   Collection Time   04/17/12  7:46 AM      Component Value Range Comment   Glucose-Capillary 93  70 - 99 mg/dL   PROTIME-INR     Status: Normal   Collection Time   04/17/12 10:55 AM      Component Value  Range Comment   Prothrombin Time 12.7  11.6 - 15.2 seconds    INR 0.93  0.00 - 1.49   GLUCOSE, CAPILLARY     Status: Abnormal   Collection Time   04/18/12  7:28 AM      Component Value Range Comment   Glucose-Capillary 105 (*) 70 - 99 mg/dL      HPI 61 y.o.female admitted to the hospital with recent progressive chest pain symptoms. She reports a recurring history of chest pain for many years, typically brief, although had a prolonged episode on the day of admission lasting for several hours. She notes no significant precipitant for this. Has baseline NYHA class II dyspnea on exertion. Cardiac markers have been negative so far. ECG shows sinus rhythm with nonspecific ST changes, early transition, small inferior Q waves.  Cardiac history is incomplete based on available information. She was previously seen by Dr. [name] with diagnosis of cardiomyopathy back in 2002, no obstructive CAD at that time. She states she had subsequent followup at [facility] while living in [place], has more recently seen Dr. Swaziland at some point within the last 2 years. It is not clear to me whether she has had any percutaneous interventions since 2002. Echocardiogram with [facility] back in 2010 demonstrated mild LVH with normal LV function, mild mitral and tricuspid regurgitation.  She reports compliance with her medications. No palpitations or syncope. Has occasional mild ankle edema. No orthopnea or PND.   HOSPITAL COURSE:  #1 chest pain cardiac enzymes were negative. Cardiology was consulted and the patient had a 2-D echocardiogram translation with results as above. Cardiac catheterization was reassuring and cardiology  recommends followup in Dr. [name] in 3-4 weeks. The patient also the CT angiography as that ruled out pulmonary embolism/underlying pneumonia  #2 hypertension currently stable patient to resume her outpatient medications  #3 hypokalemia the patient required repletion of her electrolytes especially potassium she will need a repeat BMP in one week  Discharge Exam:  Blood pressure 102/67, pulse 73, temperature 97.8 F (36.6 C), temperature source Oral, resp. rate 19, height 5\' 2"  (1.575 m), weight 96.843 kg (213 lb 8 oz), SpO2 96.00%.  General: Well developed, well nourished, in no acute distress. Obese  Head: Normocephalic and atraumatic.  Lungs: Clear to auscultation and percussion.  Heart: Normal S1 and S2. No murmur, rubs or gallops.  Abdomen: soft, non-tender, positive bowel sounds.  Extremities: No clubbing or cyanosis. No edema. Normal radial pulse at cath site.  Neurologic: Alert and oriented x 3.    Discharge Orders    Future Orders Please Complete By Expires   Diet - low sodium heart healthy      Increase activity slowly      Call MD for:  temperature >100.4      Call MD for:  persistant nausea and vomiting      Call MD for:  severe uncontrolled pain           Signed: Wenceslaus Gist 04/18/2012, 9:13 AM

## 2012-05-01 ENCOUNTER — Ambulatory Visit (INDEPENDENT_AMBULATORY_CARE_PROVIDER_SITE_OTHER): Payer: Medicare Other | Admitting: Nurse Practitioner

## 2012-05-01 ENCOUNTER — Encounter: Payer: Self-pay | Admitting: Nurse Practitioner

## 2012-05-01 VITALS — BP 114/80 | HR 91 | Ht 62.0 in | Wt 214.0 lb

## 2012-05-01 DIAGNOSIS — I428 Other cardiomyopathies: Secondary | ICD-10-CM

## 2012-05-01 MED ORDER — NITROGLYCERIN 0.4 MG SL SUBL: 0.4000 mg | SUBLINGUAL_TABLET | SUBLINGUAL | Status: DC | PRN

## 2012-05-01 NOTE — Progress Notes (Signed)
Tracy Buckley Date of Birth: 03-Dec-1950 Medical Record #[id number]  History of Present Illness: Tracy Buckley is seen today for a post hospital visit. She is seen for Dr. Swaziland. She has HTN, obesity, back pain, HLD and nonobstructive CAD per recent cath earlier this month. She is on chronic narcotics.   She comes in today. She is here alone. She says she continues to have chest pain despite the cath data. This has been a chronic issue according to her paper chart. She says she is under a lot of stress with her life. The stress aggravates her chest pain. She did not elaborate as to what stress she was under. She does use NTG on occasion and is requesting a refill. She notes that Valium helped her when she was in the hospital and she is requesting this today. No problems with her cath site (right arm). She says she is taking her medicines. She is limited by back pain and says she is not able to exercise. She has been back to see Dr. Su Hilt but she forgot to talk with him about her stress level.   Current Outpatient Prescriptions on File Prior to Visit  Medication Sig Dispense Refill  . aspirin 81 MG tablet Take 81 mg by mouth daily.      . carvedilol (COREG) 3.125 MG tablet Take 3.125 mg by mouth 2 (two) times daily with a meal.      . fluticasone (FLONASE) 50 MCG/ACT nasal spray Place 2 sprays into the nose daily.      . hydrochlorothiazide (HYDRODIURIL) 25 MG tablet Take 25 mg by mouth daily.      Marland Kitchen lisinopril (PRINIVIL,ZESTRIL) 20 MG tablet Take 20 mg by mouth daily.      Marland Kitchen morphine (MSIR) 30 MG tablet Take 30 mg by mouth 3 (three) times daily.      . pantoprazole (PROTONIX) 40 MG tablet Take 40 mg by mouth daily.      . pregabalin (LYRICA) 75 MG capsule Take 75 mg by mouth 2 (two) times daily.      . simvastatin (ZOCOR) 20 MG tablet Take 20 mg by mouth every evening.      Marland Kitchen tiZANidine (ZANAFLEX) 4 MG tablet Take 4 mg by mouth every 6 (six) hours as needed.      . nitroGLYCERIN (NITROSTAT) 0.4  MG SL tablet Place 0.4 mg under the tongue every 5 (five) minutes as needed.        No Known Allergies  Past Medical History  Diagnosis Date  . Nonischemic cardiomyopathy      No obstructive disease at catheterization 2002 - Dr. [name]   . Chronic chest pain   . Essential hypertension, benign   . Hypercholesteremia   . Diverticulitis   . Depression     Admission for suicidal ideation 2001  . Polysubstance abuse      Remote history    Past Surgical History  Procedure Date  . Back surgery     x2  . Tummy tuck 2009  . Hand surgery     x2  . Hysterectomy - unknown type   . Cesarean section   . Shoulder surgery   . Kidney stone surgery     History  Smoking status  . Former Smoker  . Types: Cigarettes  . Quit date: 10/14/1998  Smokeless tobacco  . Never Used    History  Alcohol Use No    Family History  Problem Relation Age of Onset  . Heart  disease    . Cancer    . HIV    . Coronary artery disease    . Hypertension    . Diabetes      Review of Systems: The review of systems is positive for stress.  All other systems were reviewed and are negative.  Physical Exam: BP 114/80  Pulse 91  Ht 5\' 2"  (1.575 m)  Wt 214 lb (97.07 kg)  BMI 39.14 kg/m2  LABORATORY DATA:  Echo Study Conclusions  - Left ventricle: The cavity size was normal. Wall thickness was increased in a pattern of mild LVH. Systolic function was normal. The estimated ejection fraction was in the range of 50% to 55%. Wall motion was normal; there were no regional wall motion abnormalities. Doppler parameters are consistent with abnormal left ventricular relaxation (grade 1 diastolic dysfunction). - Mitral valve: Mild regurgitation. - Pulmonary arteries: Systolic pressure was mildly increased. PA peak pressure: 41mm Hg (S).  Lab Results  Component Value Date   WBC 3.6* 04/16/2012   HGB 11.2* 04/16/2012   HCT 34.1* 04/16/2012   PLT 204 04/16/2012   GLUCOSE 135* 04/17/2012   CHOL 173  04/16/2012   TRIG 200* 04/16/2012   HDL 63 04/16/2012   LDLCALC 70 04/16/2012   ALT 19 04/15/2012   AST 23 04/15/2012   NA 137 04/17/2012   K 3.1* 04/17/2012   CL 100 04/17/2012   CREATININE 0.74 04/17/2012   BUN 10 04/17/2012   CO2 29 04/17/2012   TSH 3.142 04/16/2012   INR 0.93 04/17/2012   HGBA1C 5.8* 04/16/2012    Assessment / Plan:

## 2012-05-01 NOTE — Assessment & Plan Note (Signed)
Patient presents today post cath. EF is just low normal at 50 to 55%. She does not have obstructive CAD. Her outlook is felt to be good from our standpoint. I advised risk factor modification with diet, exercise (such as a water program) and weight loss. I have asked her to follow up with Dr. Su Hilt regarding a prescription for Valium and that I thought lifestyle changes were a more appropriate way of dealing with stress. When I suggested this, she became very agitated and said she was offended and that I did not understand her situation. She immediately left the exam room. She was not examined during this visit.

## 2012-05-01 NOTE — Patient Instructions (Addendum)
I would recommend a regular exercise program  Stay on your current medicines  We will see you back in 6 months  Call the [facility] office at [phone] if you have any questions, problems or concerns.

## 2013-01-01 ENCOUNTER — Emergency Department (INDEPENDENT_AMBULATORY_CARE_PROVIDER_SITE_OTHER)
Admission: EM | Admit: 2013-01-01 | Discharge: 2013-01-01 | Disposition: A | Discharge status: Home / Self Care | Payer: Medicare Other | Source: Home / Self Care | Attending: Emergency Medicine | Admitting: Emergency Medicine

## 2013-01-01 ENCOUNTER — Encounter (HOSPITAL_COMMUNITY): Payer: Self-pay | Admitting: Emergency Medicine

## 2013-01-01 DIAGNOSIS — R05 Cough: Secondary | ICD-10-CM

## 2013-01-01 DIAGNOSIS — J029 Acute pharyngitis, unspecified: Secondary | ICD-10-CM

## 2013-01-01 MED ORDER — GI COCKTAIL ~~LOC~~
ORAL | Status: AC
  Filled 2013-01-01: qty 30

## 2013-01-01 MED ORDER — PREDNISONE 20 MG PO TABS: 40.0000 mg | ORAL_TABLET | Freq: Every day | ORAL | Status: AC

## 2013-01-01 MED ORDER — GI COCKTAIL ~~LOC~~
30.0000 mL | Freq: Once | ORAL | Status: AC
  Administered 2013-01-01: 30 mL via ORAL

## 2013-01-01 MED ORDER — BENZONATATE 100 MG PO CAPS: 100.0000 mg | ORAL_CAPSULE | Freq: Three times a day (TID) | ORAL | Status: DC | PRN

## 2013-01-01 NOTE — ED Provider Notes (Addendum)
History     CSN: [id number]  Arrival date & time 01/01/13  1323   First MD Initiated Contact with Patient 01/01/13 1356      Chief Complaint  Patient presents with  . URI    (Consider location/radiation/quality/duration/timing/severity/associated sxs/prior treatment) HPI Comments: Patient presents urgent care this afternoon complaining of cold-like symptoms for 2 days which she describes as coughing up white phlegm with streaks of blood occasionally but with some nasal congestion but predominantly a sore throat that has increased in severity today as she is expressing difficulty when she swallows. Patient has also been taking some unknown over-the-counter cough drops. She is describing her cough today that has gone from whitish to yellowish in character. She did see a couple of episodes in which she had a streak of blood. Denies any shortness of breath and describes that she had to take some light the last night and some Chloraseptic. Denies any abdominal pain chest pains headaches or fevers  Patient is a 62 y.o. female presenting with URI. The history is provided by the patient.  URI Presenting symptoms: congestion, cough and rhinorrhea   Presenting symptoms: no ear pain, no fatigue and no fever   Severity:  Moderate Onset quality:  Gradual Duration:  2 days Timing:  Constant Progression:  Worsening Exacerbated by: Swallowing. Ineffective treatments:  Decongestant and OTC medications Associated symptoms: headaches   Associated symptoms: no arthralgias, no myalgias, no neck pain, no sinus pain, no sneezing, no swollen glands and no wheezing   Risk factors: chronic cardiac disease   Risk factors: no recent illness, no recent travel and no sick contacts     Past Medical History  Diagnosis Date  . Nonischemic cardiomyopathy      No obstructive disease at catheterization 2002 - Dr. [name] and in July of 2013. EF is 50 to 55%. Grade 1 diastolic dysfunction noted on echo 04/2012  .  Chronic chest pain   . Essential hypertension, benign   . Hypercholesteremia   . Diverticulitis   . Depression     Admission for suicidal ideation 2001  . Polysubstance abuse      Remote history  . Obesity     Past Surgical History  Procedure Laterality Date  . Back surgery      x2  . Tummy tuck  2009  . Hand surgery      x2  . Hysterectomy - unknown type    . Cesarean section    . Shoulder surgery    . Kidney stone surgery      Family History  Problem Relation Age of Onset  . Heart disease    . Cancer    . HIV    . Coronary artery disease    . Hypertension    . Diabetes      History  Substance Use Topics  . Smoking status: Former Smoker    Types: Cigarettes    Quit date: 10/14/1998  . Smokeless tobacco: Never Used  . Alcohol Use: No    OB History   Grav Para Term Preterm Abortions TAB SAB Ect Mult Living                  Review of Systems  Constitutional: Positive for appetite change. Negative for fever, chills, fatigue and unexpected weight change.  HENT: Positive for congestion and rhinorrhea. Negative for ear pain, nosebleeds, facial swelling, sneezing, mouth sores, neck pain, dental problem, postnasal drip and ear discharge.   Eyes: Negative for photophobia.  Respiratory: Positive for cough. Negative for apnea, chest tightness, wheezing and stridor.   Gastrointestinal: Negative for vomiting, abdominal pain, diarrhea and abdominal distention.  Musculoskeletal: Negative for myalgias and arthralgias.  Skin: Negative for rash.  Neurological: Positive for headaches. Negative for dizziness.    Allergies  Review of patient's allergies indicates no known allergies.  Home Medications   Current Outpatient Rx  Name  Route  Sig  Dispense  Refill  . aspirin 81 MG tablet   Oral   Take 81 mg by mouth daily.         . carvedilol (COREG) 3.125 MG tablet   Oral   Take 3.125 mg by mouth 2 (two) times daily with a meal.         . fluticasone (FLONASE)  50 MCG/ACT nasal spray   Nasal   Place 2 sprays into the nose daily.         . hydrochlorothiazide (HYDRODIURIL) 25 MG tablet   Oral   Take 25 mg by mouth daily.         Marland Kitchen lisinopril (PRINIVIL,ZESTRIL) 20 MG tablet   Oral   Take 20 mg by mouth daily.         Marland Kitchen morphine (MSIR) 30 MG tablet   Oral   Take 30 mg by mouth 3 (three) times daily.         . nitroGLYCERIN (NITROSTAT) 0.4 MG SL tablet   Sublingual   Place 1 tablet (0.4 mg total) under the tongue every 5 (five) minutes as needed.   25 tablet   6   . pantoprazole (PROTONIX) 40 MG tablet   Oral   Take 40 mg by mouth daily.         . pregabalin (LYRICA) 75 MG capsule   Oral   Take 75 mg by mouth 2 (two) times daily.         . simvastatin (ZOCOR) 20 MG tablet   Oral   Take 20 mg by mouth every evening.         Marland Kitchen tiZANidine (ZANAFLEX) 4 MG tablet   Oral   Take 4 mg by mouth every 6 (six) hours as needed.         . benzonatate (TESSALON) 100 MG capsule   Oral   Take 1 capsule (100 mg total) by mouth 3 (three) times daily as needed for cough.   21 capsule   0   . predniSONE (DELTASONE) 20 MG tablet   Oral   Take 2 tablets (40 mg total) by mouth daily. 2 tablets daily for 5 days   10 tablet   0     BP 132/84  Pulse 90  Temp(Src) 99 F (37.2 C) (Oral)  Resp 14  SpO2 95%  Physical Exam  Nursing note and vitals reviewed. Constitutional: Vital signs are normal. She appears well-developed and well-nourished.  Non-toxic appearance. She does not have a sickly appearance. She does not appear ill. No distress.  HENT:  Head: Normocephalic.  Mouth/Throat: Uvula is midline and mucous membranes are normal. Posterior oropharyngeal erythema present. No oropharyngeal exudate, posterior oropharyngeal edema or tonsillar abscesses.    Eyes: Conjunctivae are normal. No scleral icterus.  Neck: Neck supple. No JVD present.  Pulmonary/Chest: Effort normal and breath sounds normal. She has no wheezes. She  has no rales. She exhibits no tenderness.  Abdominal: She exhibits no distension and no mass. There is no tenderness. There is no rebound and no guarding.  Lymphadenopathy:  She has no cervical adenopathy.  Neurological: She is alert.  Skin: No rash noted. No erythema.    ED Course  Procedures (including critical care time)  Labs Reviewed  POCT RAPID STREP A ([facility] ONLY)   No results found.   1. Cough   2. Pharyngitis     Strep test negative  MDM  Symptoms the signs of her mainly consistent with an upper respiratory process most likely viral we'll treat patient symptomatically. Patient is requesting something for her throat at clinic. Have ordered patient to gargle with GI cocktail for local anesthetic. This encouraged use of over-the-counter local anesthetics. To manage discomfort with Tylenol and Motrin.      Jimmie Molly, MD 01/01/13 1547  Post discharge patient is requesting something to be used for her sore throat. At this point we have and states and narcotics as patient is driving we recommended to use lidocaine viscous and gargle it have this encouraged patient to use local anesthetics as this might be also a cause or irritation. There is no obstructive pattern to her pharyngitis.  Jimmie Molly, MD 01/01/13 1719

## 2013-01-01 NOTE — ED Notes (Signed)
Pt c/o cold symptoms sore throat, coughing, chills, no fever. Mild nasal congestion has used nasal spray and Chloroacet for the past 2 days with mild relief. Also has been using cough drops. Still has productive cough with yellowish phlegm, pt reports coughing up blood on a couple of occasions. Took Nyquil last night with mild relief. Granddaughter has been sick and staying with her. Patient is alert and oriented.

## 2013-01-16 ENCOUNTER — Encounter (HOSPITAL_COMMUNITY): Payer: Self-pay | Admitting: Emergency Medicine

## 2013-01-16 ENCOUNTER — Other Ambulatory Visit: Payer: Self-pay

## 2013-01-16 ENCOUNTER — Emergency Department (HOSPITAL_COMMUNITY)
Admission: EM | Admit: 2013-01-16 | Discharge: 2013-01-17 | Disposition: A | Discharge status: Home / Self Care | Payer: Medicare Other | Attending: Emergency Medicine | Admitting: Emergency Medicine

## 2013-01-16 ENCOUNTER — Emergency Department (HOSPITAL_COMMUNITY): Payer: Medicare Other

## 2013-01-16 DIAGNOSIS — Z79899 Other long term (current) drug therapy: Secondary | ICD-10-CM | POA: Insufficient documentation

## 2013-01-16 DIAGNOSIS — Z8659 Personal history of other mental and behavioral disorders: Secondary | ICD-10-CM | POA: Insufficient documentation

## 2013-01-16 DIAGNOSIS — IMO0002 Reserved for concepts with insufficient information to code with codable children: Secondary | ICD-10-CM | POA: Insufficient documentation

## 2013-01-16 DIAGNOSIS — R0602 Shortness of breath: Secondary | ICD-10-CM | POA: Insufficient documentation

## 2013-01-16 DIAGNOSIS — R05 Cough: Secondary | ICD-10-CM | POA: Insufficient documentation

## 2013-01-16 DIAGNOSIS — R000 Tachycardia, unspecified: Secondary | ICD-10-CM | POA: Insufficient documentation

## 2013-01-16 DIAGNOSIS — E669 Obesity, unspecified: Secondary | ICD-10-CM | POA: Insufficient documentation

## 2013-01-16 DIAGNOSIS — Z7982 Long term (current) use of aspirin: Secondary | ICD-10-CM | POA: Insufficient documentation

## 2013-01-16 DIAGNOSIS — E7800 Pure hypercholesterolemia, unspecified: Secondary | ICD-10-CM | POA: Insufficient documentation

## 2013-01-16 DIAGNOSIS — R509 Fever, unspecified: Secondary | ICD-10-CM | POA: Insufficient documentation

## 2013-01-16 DIAGNOSIS — Z87891 Personal history of nicotine dependence: Secondary | ICD-10-CM | POA: Insufficient documentation

## 2013-01-16 DIAGNOSIS — I10 Essential (primary) hypertension: Secondary | ICD-10-CM | POA: Insufficient documentation

## 2013-01-16 DIAGNOSIS — G8929 Other chronic pain: Secondary | ICD-10-CM | POA: Insufficient documentation

## 2013-01-16 DIAGNOSIS — J40 Bronchitis, not specified as acute or chronic: Secondary | ICD-10-CM

## 2013-01-16 DIAGNOSIS — R059 Cough, unspecified: Secondary | ICD-10-CM | POA: Insufficient documentation

## 2013-01-16 DIAGNOSIS — J00 Acute nasopharyngitis [common cold]: Secondary | ICD-10-CM | POA: Insufficient documentation

## 2013-01-16 DIAGNOSIS — Z8719 Personal history of other diseases of the digestive system: Secondary | ICD-10-CM | POA: Insufficient documentation

## 2013-01-16 DIAGNOSIS — Z8679 Personal history of other diseases of the circulatory system: Secondary | ICD-10-CM | POA: Insufficient documentation

## 2013-01-16 LAB — CBC WITH DIFFERENTIAL/PLATELET
Eosinophils Relative: 2 % (ref 0–5)
HCT: 35.8 % — ABNORMAL LOW (ref 36.0–46.0)
Hemoglobin: 11.6 g/dL — ABNORMAL LOW (ref 12.0–15.0)
Lymphocytes Relative: 15 % (ref 12–46)
Lymphs Abs: 1.1 10*3/uL (ref 0.7–4.0)
MCV: 97.3 fL (ref 78.0–100.0)
Monocytes Relative: 6 % (ref 3–12)
Platelets: 216 10*3/uL (ref 150–400)
RBC: 3.68 MIL/uL — ABNORMAL LOW (ref 3.87–5.11)
WBC: 7.7 10*3/uL (ref 4.0–10.5)

## 2013-01-16 LAB — BASIC METABOLIC PANEL
CO2: 34 mEq/L — ABNORMAL HIGH (ref 19–32)
Glucose, Bld: 133 mg/dL — ABNORMAL HIGH (ref 70–99)
Potassium: 3.3 mEq/L — ABNORMAL LOW (ref 3.5–5.1)
Sodium: 139 mEq/L (ref 135–145)

## 2013-01-16 MED ORDER — ALBUTEROL SULFATE (5 MG/ML) 0.5% IN NEBU
5.0000 mg | INHALATION_SOLUTION | Freq: Once | RESPIRATORY_TRACT | Status: AC
  Administered 2013-01-16: 5 mg via RESPIRATORY_TRACT
  Filled 2013-01-16: qty 1

## 2013-01-16 MED ORDER — PREDNISONE 20 MG PO TABS
60.0000 mg | ORAL_TABLET | Freq: Once | ORAL | Status: AC
  Administered 2013-01-16: 60 mg via ORAL
  Filled 2013-01-16: qty 3

## 2013-01-16 MED ORDER — IPRATROPIUM BROMIDE 0.02 % IN SOLN
0.5000 mg | Freq: Once | RESPIRATORY_TRACT | Status: AC
  Administered 2013-01-16: 0.5 mg via RESPIRATORY_TRACT
  Filled 2013-01-16: qty 2.5

## 2013-01-16 MED ORDER — HYDROCODONE-ACETAMINOPHEN 5-325 MG PO TABS
2.0000 | ORAL_TABLET | Freq: Once | ORAL | Status: AC
  Administered 2013-01-16: 2 via ORAL
  Filled 2013-01-16: qty 2

## 2013-01-16 NOTE — ED Provider Notes (Signed)
History     CSN: [id number]  Arrival date & time 01/16/13  2123   First MD Initiated Contact with Patient 01/16/13 2200      Chief Complaint  Patient presents with  . Shortness of Breath  . Chest Pain    (Consider location/radiation/quality/duration/timing/severity/associated sxs/prior treatment) HPI History provided by pt.   Pt reports that she has had a "cold" w/ persistent cough x 3 weeks.  Has been treated by her PCP w/ Zpak, 5 day course of prednisone and cough medication.  Her cough acutely worsened yesterday.  It causes pain in her chest and SOB and is associated w/ tactile fever.  No h/o pulmonary disease.   Past Medical History  Diagnosis Date  . Nonischemic cardiomyopathy      No obstructive disease at catheterization 2002 - Dr. [name] and in July of 2013. EF is 50 to 55%. Grade 1 diastolic dysfunction noted on echo 04/2012  . Chronic chest pain   . Essential hypertension, benign   . Hypercholesteremia   . Diverticulitis   . Depression     Admission for suicidal ideation 2001  . Polysubstance abuse      Remote history  . Obesity     Past Surgical History  Procedure Laterality Date  . Back surgery      x2  . Tummy tuck  2009  . Hand surgery      x2  . Hysterectomy - unknown type    . Cesarean section    . Shoulder surgery    . Kidney stone surgery      Family History  Problem Relation Age of Onset  . Heart disease    . Cancer    . HIV    . Coronary artery disease    . Hypertension    . Diabetes      History  Substance Use Topics  . Smoking status: Former Smoker    Types: Cigarettes    Quit date: 10/14/1998  . Smokeless tobacco: Never Used  . Alcohol Use: No    OB History   Grav Para Term Preterm Abortions TAB SAB Ect Mult Living                  Review of Systems  All other systems reviewed and are negative.    Allergies  Review of patient's allergies indicates no known allergies.  Home Medications   Current Outpatient Rx  Name   Route  Sig  Dispense  Refill  . aspirin 81 MG tablet   Oral   Take 81 mg by mouth daily.         . benzonatate (TESSALON) 100 MG capsule   Oral   Take 1 capsule (100 mg total) by mouth 3 (three) times daily as needed for cough.   21 capsule   0   . carvedilol (COREG) 3.125 MG tablet   Oral   Take 3.125 mg by mouth 2 (two) times daily with a meal.         . fluticasone (FLONASE) 50 MCG/ACT nasal spray   Nasal   Place 2 sprays into the nose daily.         . hydrochlorothiazide (HYDRODIURIL) 25 MG tablet   Oral   Take 25 mg by mouth daily.         Marland Kitchen lisinopril (PRINIVIL,ZESTRIL) 20 MG tablet   Oral   Take 20 mg by mouth daily.         Marland Kitchen morphine (MSIR) 30  MG tablet   Oral   Take 30 mg by mouth 3 (three) times daily.         . nitroGLYCERIN (NITROSTAT) 0.4 MG SL tablet   Sublingual   Place 1 tablet (0.4 mg total) under the tongue every 5 (five) minutes as needed.   25 tablet   6   . pantoprazole (PROTONIX) 40 MG tablet   Oral   Take 40 mg by mouth daily.         . pregabalin (LYRICA) 75 MG capsule   Oral   Take 75 mg by mouth 2 (two) times daily.         . simvastatin (ZOCOR) 20 MG tablet   Oral   Take 20 mg by mouth every evening.         Marland Kitchen tiZANidine (ZANAFLEX) 4 MG tablet   Oral   Take 4 mg by mouth every 6 (six) hours as needed.           BP 124/63  Pulse 106  Temp(Src) 99.1 F (37.3 C) (Oral)  Resp 20  Ht 5\' 2"  (1.575 m)  Wt 210 lb (95.255 kg)  BMI 38.4 kg/m2  SpO2 98%  Physical Exam  Nursing note and vitals reviewed. Constitutional: She is oriented to person, place, and time. She appears well-developed and well-nourished. No distress.  HENT:  Head: Normocephalic and atraumatic.  Eyes:  Normal appearance  Neck: Normal range of motion.  Cardiovascular: Regular rhythm and intact distal pulses.   Mild tachycardia  Pulmonary/Chest: Effort normal and breath sounds normal. No respiratory distress.  Coughing.  Dyspneic; has to  rest during sentences because it induces cough.  Audible upper respiratory sounds when talking.   Musculoskeletal: Normal range of motion.  No LE edema/ttp  Neurological: She is alert and oriented to person, place, and time.  Skin: Skin is warm and dry. No rash noted.  Psychiatric: She has a normal mood and affect. Her behavior is normal.    ED Course  Procedures (including critical care time)  Labs Reviewed  CBC WITH DIFFERENTIAL - Abnormal; Notable for the following:    RBC 3.68 (*)    Hemoglobin 11.6 (*)    HCT 35.8 (*)    All other components within normal limits  BASIC METABOLIC PANEL - Abnormal; Notable for the following:    Potassium 3.3 (*)    CO2 34 (*)    Glucose, Bld 133 (*)    GFR calc non Af Amer 77 (*)    GFR calc Af Amer 90 (*)    All other components within normal limits  PRO B NATRIURETIC PEPTIDE   Dg Chest 2 View  01/16/2013  *RADIOLOGY REPORT*  Clinical Data: Severe cough, chest pain, shortness of breath, fever, chills.  CHEST - 2 VIEW  Comparison: 04/16/2012  Findings: Shallow inspiration. The heart size and pulmonary vascularity are normal. The lungs appear clear and expanded without focal air space disease or consolidation. No blunting of the costophrenic angles.  No pneumothorax.  Mediastinal contours appear intact.  Surgical clips in the right upper quadrant.  Mild degenerative changes in the spine.  Stable appearance since previous study.  IMPRESSION: No evidence of active pulmonary disease.   Original Report Authenticated By: Burman Nieves, M.D.      1. Bronchitis       MDM  [phone] F presents w/ cough x 3 weeks, acutely worsened yesterday.  No h/o pulmonary disease.  On exam, afebrile, mildly tachycardic, severe cough that limits  her ability to speak as well as upper airway sounds, no LE edema.  CXR negative.  Results discussed w/ pt.  Pt to receive breathing treatment and hydrocodone for cough suppression.  Will reassess shortly.  11:09 PM   After a  second breathing treatment, cough sounds to be mildly improved.  Breath sounds unchanged.  Respiratory therapy has no other recommendations at this time.  Pt ambulated, maintained her sats and did not become dyspneic.  Dr. Ranae Palms recommends that she trial claritin.  Pt d/c'd home w/ albuterol inhaler, prednisone and vicodin (insurance doesn't cover tussionex).  Referred back to her PCP as well as to pulmonology for persistent sx. Return precautions discussed. 2:35 AM     Otilio Miu, PA-C 01/17/13 0236  Arie Sabina Lonnel Gjerde, PA-C 01/17/13 [phone]

## 2013-01-16 NOTE — ED Notes (Signed)
Pt states that she has been having cold symptoms for about 3 weeks. The patient developed chest pain 3 days ago. The pt reports that she took a nitro which helped relieved the pain. The pt states that she has been taking Nyquil for her symptoms.

## 2013-01-17 MED ORDER — HYDROCODONE-ACETAMINOPHEN 5-325 MG PO TABS: 2.0000 | ORAL_TABLET | ORAL | Status: DC | PRN

## 2013-01-17 MED ORDER — IPRATROPIUM BROMIDE 0.02 % IN SOLN
0.5000 mg | Freq: Once | RESPIRATORY_TRACT | Status: AC
  Administered 2013-01-17: 0.5 mg via RESPIRATORY_TRACT
  Filled 2013-01-17: qty 2.5

## 2013-01-17 MED ORDER — ALBUTEROL SULFATE (5 MG/ML) 0.5% IN NEBU
5.0000 mg | INHALATION_SOLUTION | Freq: Once | RESPIRATORY_TRACT | Status: AC
  Administered 2013-01-17: 5 mg via RESPIRATORY_TRACT
  Filled 2013-01-17: qty 1

## 2013-01-17 MED ORDER — ALBUTEROL SULFATE HFA 108 (90 BASE) MCG/ACT IN AERS
2.0000 | INHALATION_SPRAY | Freq: Once | RESPIRATORY_TRACT | Status: AC
  Administered 2013-01-17: 2 via RESPIRATORY_TRACT
  Filled 2013-01-17: qty 6.7

## 2013-01-17 MED ORDER — PREDNISONE 20 MG PO TABS: 20.0000 mg | ORAL_TABLET | Freq: Two times a day (BID) | ORAL | Status: DC

## 2013-01-17 MED ORDER — HYDROCOD POLST-CHLORPHEN POLST 10-8 MG/5ML PO LQCR
5.0000 mL | Freq: Once | ORAL | Status: AC
  Administered 2013-01-17: 5 mL via ORAL
  Filled 2013-01-17: qty 5

## 2013-01-18 NOTE — ED Provider Notes (Signed)
Medical screening examination/treatment/procedure(s) were performed by non-physician practitioner and as supervising physician I was immediately available for consultation/collaboration.   Loren Racer, MD 01/18/13 [phone]

## 2013-11-10 ENCOUNTER — Encounter (HOSPITAL_COMMUNITY): Payer: Self-pay | Admitting: Emergency Medicine

## 2013-11-10 ENCOUNTER — Emergency Department (INDEPENDENT_AMBULATORY_CARE_PROVIDER_SITE_OTHER)
Admission: EM | Admit: 2013-11-10 | Discharge: 2013-11-10 | Discharge status: Against Medical Advice | Payer: Medicare Other | Source: Home / Self Care | Attending: Family Medicine | Admitting: Family Medicine

## 2013-11-10 DIAGNOSIS — K0889 Other specified disorders of teeth and supporting structures: Secondary | ICD-10-CM

## 2013-11-10 DIAGNOSIS — K089 Disorder of teeth and supporting structures, unspecified: Secondary | ICD-10-CM

## 2013-11-10 NOTE — ED Notes (Signed)
See physician note

## 2013-11-10 NOTE — ED Provider Notes (Signed)
Tracy Buckley is a 63 y.o. female who presents to Urgent Care today for right lower rear tooth pain. This is been present off and on since June. She's been to her dentist several times and had a temporary filling placed. The filling fell out in November. She has been referred to a second dentist for tooth extraction and bridge placement back in December. She has been too busy to go to the second dentist. She has been prescribed Percocet for this before in the past which seemed to help some. She notes that her chronic morphine and tramadol did not seem to help much.  The pain worsened over the last several days. She notes moderate pain worse with chewing. She denies any fevers or chills nausea vomiting or diarrhea.  Controlled substance database shows 60 tablets of 15 mg morphine IR and 90 tablets of 30 mg morphine ER prescribed by Dr. Su Hilt on October 19, 2013.  Past Medical History  Diagnosis Date  . Nonischemic cardiomyopathy      No obstructive disease at catheterization 2002 - Dr. [name] and in July of 2013. EF is 50 to 55%. Grade 1 diastolic dysfunction noted on echo 04/2012  . Chronic chest pain   . Essential hypertension, benign   . Hypercholesteremia   . Diverticulitis   . Depression     Admission for suicidal ideation 2001  . Polysubstance abuse      Remote history  . Obesity    History  Substance Use Topics  . Smoking status: Former Smoker    Types: Cigarettes    Quit date: 10/14/1998  . Smokeless tobacco: Never Used  . Alcohol Use: No   ROS as above Medications: No current facility-administered medications for this encounter.   Current Outpatient Prescriptions  Medication Sig Dispense Refill  . aspirin 81 MG tablet Take 81 mg by mouth every morning.       . benzonatate (TESSALON) 100 MG capsule Take 1 capsule (100 mg total) by mouth 3 (three) times daily as needed for cough.  21 capsule  0  . carvedilol (COREG) 3.125 MG tablet Take 3.125 mg by mouth 2 (two) times  daily with a meal.      . fluticasone (FLONASE) 50 MCG/ACT nasal spray Place 2 sprays into the nose daily.      . hydrochlorothiazide (HYDRODIURIL) 25 MG tablet Take 25 mg by mouth every morning.       Marland Kitchen HYDROcodone-acetaminophen (NORCO/VICODIN) 5-325 MG per tablet Take 2 tablets by mouth every 4 (four) hours as needed for pain.  20 tablet  0  . lisinopril (PRINIVIL,ZESTRIL) 20 MG tablet Take 20 mg by mouth daily.      Marland Kitchen morphine (MS CONTIN) 30 MG 12 hr tablet Take 30 mg by mouth 2 (two) times daily.      . nitroGLYCERIN (NITROSTAT) 0.4 MG SL tablet Place 1 tablet (0.4 mg total) under the tongue every 5 (five) minutes as needed.  25 tablet  6  . pantoprazole (PROTONIX) 40 MG tablet Take 40 mg by mouth daily.      . phenol (CHLORASEPTIC) 1.4 % LIQD Use as directed 1 spray in the mouth or throat as needed (for cough).      . potassium chloride SA (K-DUR,KLOR-CON) 20 MEQ tablet Take 20 mEq by mouth every morning.      . predniSONE (DELTASONE) 20 MG tablet Take 1 tablet (20 mg total) by mouth 2 (two) times daily.  10 tablet  0  . PREDNISONE PO  Take 2 tablets by mouth every morning. 5 day course      . pregabalin (LYRICA) 75 MG capsule Take 75 mg by mouth at bedtime.       . Pseudoeph-Doxylamine-DM-APAP (NYQUIL PO) Take 10 mLs by mouth daily as needed (for cough or cold symptoms).      . simvastatin (ZOCOR) 20 MG tablet Take 20 mg by mouth every evening.      Marland Kitchen. tiZANidine (ZANAFLEX) 4 MG tablet Take 4 mg by mouth at bedtime as needed (muscle spasm).         Exam:  BP 126/81  Pulse 85  Temp(Src) 97.8 F (36.6 C) (Oral)  Resp 18  Ht 5\' 2"  (1.575 m)  Wt 205 lb (92.987 kg)  BMI 37.49 kg/m2  SpO2 96% Gen: Well NAD HEENT: EOMI,  MMM, lower rear most premolar with dental caries on the posterior base. Mildly tender to touch. No significant family erythema. Several missing teeth present Lungs: Normal work of breathing. CTABL Heart: RRR no MRG Exts:  warm and well perfused.    Assessment and  Plan: 63 y.o. female with dental pain.  Patient became very upset when I discussed the plan. I discussed that I would not be prescribing oxycodone as she already has chronic high-dose morphine prescribed by her primary care provider and has had ample opportunity to get this tooth taken care of.  I offered to prescribe diclofenac and antibiotics. She refused and abruptly left AMA. I was not able to discuss risk-benefits or prescribe any medications.   Discussed warning signs or symptoms. Please see discharge instructions. Patient expresses understanding.    Rodolph BongEvan S Narayan Scull, MD 11/10/13 [phone]

## 2014-03-03 DIAGNOSIS — E039 Hypothyroidism, unspecified: Secondary | ICD-10-CM | POA: Insufficient documentation

## 2014-03-03 DIAGNOSIS — K219 Gastro-esophageal reflux disease without esophagitis: Secondary | ICD-10-CM | POA: Insufficient documentation

## 2014-06-02 DIAGNOSIS — N952 Postmenopausal atrophic vaginitis: Secondary | ICD-10-CM | POA: Insufficient documentation

## 2014-09-22 ENCOUNTER — Encounter (HOSPITAL_COMMUNITY): Payer: Self-pay | Admitting: Cardiovascular Disease

## 2015-06-09 ENCOUNTER — Other Ambulatory Visit: Payer: Self-pay

## 2015-06-09 DIAGNOSIS — Z1231 Encounter for screening mammogram for malignant neoplasm of breast: Secondary | ICD-10-CM

## 2015-06-14 ENCOUNTER — Ambulatory Visit: Payer: Medicaid Other

## 2015-11-07 ENCOUNTER — Encounter: Payer: Self-pay | Admitting: Cardiology

## 2015-11-07 ENCOUNTER — Ambulatory Visit (INDEPENDENT_AMBULATORY_CARE_PROVIDER_SITE_OTHER): Payer: Medicare Other | Admitting: Cardiology

## 2015-11-07 VITALS — BP 120/90 | HR 69 | Ht 62.0 in | Wt 203.2 lb

## 2015-11-07 DIAGNOSIS — I10 Essential (primary) hypertension: Secondary | ICD-10-CM | POA: Diagnosis not present

## 2015-11-07 DIAGNOSIS — R072 Precordial pain: Secondary | ICD-10-CM | POA: Diagnosis not present

## 2015-11-07 DIAGNOSIS — E782 Mixed hyperlipidemia: Secondary | ICD-10-CM | POA: Diagnosis not present

## 2015-11-07 NOTE — Progress Notes (Signed)
Cardiology Office Note   Date:  11/07/2015   ID:  EULENE Buckley, DOB [birth year], MRN [id number]  PCP:  Tracy Peck, MD  Cardiologist:   [name], MD   Chief Complaint  Patient presents with  . New Patient (Initial Visit)    no chest pain, no shortness of breath, has edema in knee, has pain or cramping in legs, no lightheadedness or dizziness      History of Present Illness: Tracy Buckley is a 65 y.o. female who presents for pre operative cardiac evaluation at the request of Dr. [name]. She has a history of some type of cardiomyopathy when seen by Dr. Shana Buckley in 2002. She then moved to [place] and was followed by a cardiologist there. In 2010 I saw her and nuclear stress test and Echo were normal. In July 2013 she was admitted with chest pain and cardiac cath showed mild nonobstructive CAD. She has chronic chest pain. This is described as a sharp stabbing pain in the center of her chest. It may occur at rest. Not exertional. Exacerbated by stress. She does take Ntg for the pain. Reports that 2 weeks ago she was having pain every day but since then pain has resolved.     Past Medical History  Diagnosis Date  . Nonischemic cardiomyopathy (HCC)      No obstructive disease at catheterization 2002 - Dr. Shana Buckley and in July of 2013. EF is 50 to 55%. Grade 1 diastolic dysfunction noted on echo 04/2012  . Chronic chest pain   . Essential hypertension, benign   . Hypercholesteremia   . Diverticulitis   . Depression     Admission for suicidal ideation 2001  . Polysubstance abuse      Remote history  . Obesity     Past Surgical History  Procedure Laterality Date  . Back surgery      x2  . Tummy tuck  2009  . Hand surgery      x2  . Hysterectomy - unknown type    . Cesarean section    . Shoulder surgery    . Kidney stone surgery    . Left heart catheterization with coronary angiogram N/A 04/17/2012    Procedure: LEFT HEART CATHETERIZATION WITH CORONARY  ANGIOGRAM;  Surgeon: Kathleene Hazel, MD;  Location: [facility] CATH LAB;  Service: Cardiovascular;  Laterality: N/A;     Current Outpatient Prescriptions  Medication Sig Dispense Refill  . aspirin 81 MG tablet Take 81 mg by mouth every morning.     . carvedilol (COREG) 3.125 MG tablet Take 3.125 mg by mouth 2 (two) times daily with a meal.    . diazepam (VALIUM) 5 MG tablet Take 1 tablet by mouth daily. Take 1 tab daily  2  . hydrochlorothiazide (HYDRODIURIL) 25 MG tablet Take 25 mg by mouth every morning.     Marland Kitchen HYDROcodone-acetaminophen (NORCO/VICODIN) 5-325 MG per tablet Take 2 tablets by mouth every 4 (four) hours as needed for pain. 20 tablet 0  . lisinopril (PRINIVIL,ZESTRIL) 20 MG tablet Take 20 mg by mouth daily.    Marland Kitchen morphine (MS CONTIN) 30 MG 12 hr tablet Take 30 mg by mouth 2 (two) times daily.    . nitroGLYCERIN (NITROSTAT) 0.4 MG SL tablet Place 1 tablet (0.4 mg total) under the tongue every 5 (five) minutes as needed. 25 tablet 6  . pantoprazole (PROTONIX) 40 MG tablet Take 40 mg by mouth daily.    . potassium chloride SA (K-DUR,KLOR-CON)  20 MEQ tablet Take 20 mEq by mouth every morning.    . pregabalin (LYRICA) 75 MG capsule Take 75 mg by mouth at bedtime.     . Pseudoeph-Doxylamine-DM-APAP (NYQUIL PO) Take 10 mLs by mouth daily as needed (for cough or cold symptoms).    . simvastatin (ZOCOR) 20 MG tablet Take 20 mg by mouth every evening.    Marland Kitchen tiZANidine (ZANAFLEX) 4 MG tablet Take 4 mg by mouth at bedtime as needed (muscle spasm).     . traZODone (DESYREL) 50 MG tablet Take 1 tablet by mouth daily. Take 1 tab daily as needed for sleep  4   No current facility-administered medications for this visit.    Allergies:   Review of patient's allergies indicates no known allergies.    Social History:  The patient  reports that she quit smoking about 17 years ago. Her smoking use included Cigarettes. She has never used smokeless tobacco. She reports that she does not drink alcohol  or use illicit drugs.   Family History:  The patient's family history is negative for CAD.    ROS:  Please see the history of present illness.   Otherwise, review of systems are positive for none.   All other systems are reviewed and negative.    PHYSICAL EXAM: VS:  BP 120/90 mmHg  Pulse 69  Ht  (1.575 m)  Wt 92.165 kg (203 lb 3 oz)  BMI 37.15 kg/m2 , BMI Body mass index is 37.15 kg/(m^2). GEN: Morbidly obese BF, in no acute distress HEENT: normal Neck: no JVD, carotid bruits, or masses Cardiac: RRR; no murmurs, rubs, or gallops,no edema  Respiratory:  clear to auscultation bilaterally, normal work of breathing GI: soft, nontender, nondistended, + BS MS: no deformity or atrophy Skin: warm and dry, no rash Neuro:  Strength and sensation are intact Psych: euthymic mood, full affect   EKG:  EKG is ordered today. The ekg ordered today demonstrates NSR with Mild T wave inversion in the lateral leads. I have personally reviewed and interpreted this study.    Recent Labs: No results found for requested labs within last 365 days.    Lipid Panel    Component Value Date/Time   CHOL 173 04/16/2012 0030   TRIG 200* 04/16/2012 0030   HDL 63 04/16/2012 0030   CHOLHDL 2.7 04/16/2012 0030   VLDL 40 04/16/2012 0030   LDLCALC 70 04/16/2012 0030      Wt Readings from Last 3 Encounters:  11/07/15 92.165 kg (203 lb 3 oz)  11/10/13 92.987 kg (205 lb)  01/16/13 95.255 kg (210 lb)      Other studies Reviewed: Additional studies/ records that were reviewed today include: None Review of the above records demonstrates: N/A   ASSESSMENT AND PLAN:  1.  Atypical chest pain. 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 Given recent chest pain I have recommended a Lexiscan Myoview study to assess current cardiac risk. If low risk she can proceed with knee surgery.   2. HTN controlled.  3. Hypercholesterolemia. On statin.   4. Osteoarthritis of the knee.    Current  medicines are reviewed at length with the patient today.  The patient does not have concerns regarding medicines.  The following changes have been made:  no change  Labs/ tests ordered today include:  Orders Placed This Encounter  Procedures  . EKG 12-Lead    Lexiscan Myoview.   Disposition:   FU with TBA   Signed, [name],  MD  11/07/2015 2:52 PM    [facility] [address], [place], [place], [address] Phone [phone], Fax [phone]

## 2015-11-07 NOTE — Patient Instructions (Signed)
Continue your current therapy  We will schedule you for a nuclear stress test   

## 2015-11-08 ENCOUNTER — Telehealth (HOSPITAL_COMMUNITY): Payer: Self-pay

## 2015-11-08 NOTE — Telephone Encounter (Signed)
Encounter complete. 

## 2015-11-09 ENCOUNTER — Ambulatory Visit (HOSPITAL_COMMUNITY)
Admission: RE | Admit: 2015-11-09 | Discharge: 2015-11-09 | Disposition: A | Discharge status: Home / Self Care | Payer: Medicare Other | Source: Ambulatory Visit | Attending: Internal Medicine | Admitting: Internal Medicine

## 2015-11-09 DIAGNOSIS — R0602 Shortness of breath: Secondary | ICD-10-CM | POA: Diagnosis not present

## 2015-11-09 DIAGNOSIS — Z6837 Body mass index (BMI) 37.0-37.9, adult: Secondary | ICD-10-CM | POA: Insufficient documentation

## 2015-11-09 DIAGNOSIS — R0609 Other forms of dyspnea: Secondary | ICD-10-CM | POA: Insufficient documentation

## 2015-11-09 DIAGNOSIS — R072 Precordial pain: Secondary | ICD-10-CM | POA: Diagnosis not present

## 2015-11-09 DIAGNOSIS — R5383 Other fatigue: Secondary | ICD-10-CM | POA: Diagnosis not present

## 2015-11-09 DIAGNOSIS — I10 Essential (primary) hypertension: Secondary | ICD-10-CM | POA: Diagnosis not present

## 2015-11-09 DIAGNOSIS — E663 Overweight: Secondary | ICD-10-CM | POA: Diagnosis not present

## 2015-11-09 DIAGNOSIS — Z87891 Personal history of nicotine dependence: Secondary | ICD-10-CM | POA: Diagnosis not present

## 2015-11-09 DIAGNOSIS — E782 Mixed hyperlipidemia: Secondary | ICD-10-CM | POA: Insufficient documentation

## 2015-11-09 LAB — MYOCARDIAL PERFUSION IMAGING
CHL CUP RESTING HR STRESS: 74 {beats}/min
CSEPPHR: 99 {beats}/min
LV dias vol: 91 mL
LVSYSVOL: 37 mL
SDS: 2
SRS: 0
SSS: 2
TID: 1.14

## 2015-11-09 MED ORDER — TECHNETIUM TC 99M SESTAMIBI GENERIC - CARDIOLITE
30.8000 | Freq: Once | INTRAVENOUS | Status: AC | PRN
  Administered 2015-11-09: 30.8 via INTRAVENOUS

## 2015-11-09 MED ORDER — AMINOPHYLLINE 25 MG/ML IV SOLN
100.0000 mg | Freq: Once | INTRAVENOUS | Status: AC
  Administered 2015-11-09: 100 mg via INTRAVENOUS

## 2015-11-09 MED ORDER — TECHNETIUM TC 99M SESTAMIBI GENERIC - CARDIOLITE
10.0000 | Freq: Once | INTRAVENOUS | Status: AC | PRN
  Administered 2015-11-09: 10 via INTRAVENOUS

## 2015-11-09 MED ORDER — REGADENOSON 0.4 MG/5ML IV SOLN
0.4000 mg | Freq: Once | INTRAVENOUS | Status: AC
  Administered 2015-11-09: 0.4 mg via INTRAVENOUS

## 2015-11-13 ENCOUNTER — Telehealth: Payer: Self-pay

## 2015-11-13 NOTE — Telephone Encounter (Signed)
Received surgical clearance from [facility].Dr.Jordan cleared patient for surgery.Form faxed to fax # [phone].

## 2015-11-14 ENCOUNTER — Telehealth: Payer: Self-pay | Admitting: Cardiology

## 2015-11-14 NOTE — Telephone Encounter (Signed)
New message      Calling to see if pt is cleared to have knee surgery.  Ok to leave msg on vm

## 2015-11-15 NOTE — Telephone Encounter (Signed)
Returned call to patient.Dr.Jordan cleared you for surgery.I faxed clearance letter to Dr.Murhpy Thurston Hole on 11/13/15.

## 2015-11-30 NOTE — H&P (Signed)
TOTAL KNEE ADMISSION H&P  Patient is being admitted for right unicompartmental knee arthroplasty.  Subjective:  Chief Complaint:right knee pain.  HPI: Tracy Buckley, 65 y.o. female, has a history of pain and functional disability in the right knee due to arthritis and has failed non-surgical conservative treatments for greater than 12 weeks to includeNSAID's and/or analgesics, corticosteriod injections, viscosupplementation injections, use of assistive devices and activity modification.  Onset of symptoms was gradual, starting 3 years ago with gradually worsening course since that time. The patient noted no past surgery on the right knee(s).  Patient currently rates pain in the right knee(s) at 10 out of 10 with activity. Patient has night pain, worsening of pain with activity and weight bearing, pain that interferes with activities of daily living and joint swelling.  Patient has evidence of joint space narrowing by imaging studies. There is no active infection.  Patient Active Problem List   Diagnosis Date Noted  . Precordial pain 04/16/2012  . Nonischemic cardiomyopathy (HCC) 04/16/2012  . Essential hypertension, benign 04/16/2012  . Mixed hyperlipidemia 04/16/2012   Past Medical History  Diagnosis Date  . Nonischemic cardiomyopathy (HCC)      No obstructive disease at catheterization 2002 - Dr. [name] and in July of 2013. EF is 50 to 55%. Grade 1 diastolic dysfunction noted on echo 04/2012  . Chronic chest pain   . Essential hypertension, benign   . Hypercholesteremia   . Diverticulitis   . Depression     Admission for suicidal ideation 2001  . Polysubstance abuse      Remote history  . Obesity     Past Surgical History  Procedure Laterality Date  . Back surgery      x2  . Tummy tuck  2009  . Hand surgery      x2  . Hysterectomy - unknown type    . Cesarean section    . Shoulder surgery    . Kidney stone surgery    . Left heart catheterization with coronary angiogram  N/A 04/17/2012    Procedure: LEFT HEART CATHETERIZATION WITH CORONARY ANGIOGRAM;  Surgeon: Kathleene Hazel, MD;  Location: [facility] CATH LAB;  Service: Cardiovascular;  Laterality: N/A;    No prescriptions prior to admission   No Known Allergies  Social History  Substance Use Topics  . Smoking status: Former Smoker    Types: Cigarettes    Quit date: 10/14/1998  . Smokeless tobacco: Never Used  . Alcohol Use: No    Family History  Problem Relation Age of Onset  . Heart disease    . Cancer    . HIV    . Coronary artery disease    . Hypertension    . Diabetes       Review of Systems  Constitutional: Negative for fever and chills.  HENT: Negative for congestion and sore throat.   Eyes: Negative for blurred vision.  Respiratory: Negative for cough and wheezing.   Cardiovascular: Negative for chest pain and palpitations.  Gastrointestinal: Negative for nausea, vomiting and abdominal pain.  Musculoskeletal:       Antalgic gait, R knee pain with ambulation  Skin: Negative for rash.  Neurological: Negative for dizziness and loss of consciousness.  Psychiatric/Behavioral: Negative for depression and suicidal ideas.  All other systems reviewed and are negative.   Objective:  Physical Exam  Vitals reviewed. Constitutional: She is oriented to person, place, and time. She appears well-developed and well-nourished.  HENT:  Head: Normocephalic and atraumatic.  Eyes:  Conjunctivae and EOM are normal. Pupils are equal, round, and reactive to light.  Neck: Normal range of motion. Neck supple.  Cardiovascular: Normal rate and intact distal pulses.   Respiratory: Effort normal and breath sounds normal.  GI: Soft. Bowel sounds are normal.  Musculoskeletal: Normal range of motion. She exhibits tenderness (MJL).  Neurological: She is alert and oriented to person, place, and time.  Skin: Skin is warm and dry.  Psychiatric: She has a normal mood and affect. Her behavior is normal. Judgment  and thought content normal.    Vital signs in last 24 hours:    Labs:   Estimated body mass index is 37.12 kg/(m^2) as calculated from the following:   Height as of 11/09/15:  (1.575 m).   Weight as of 11/09/15: 92.08 kg (203 lb).   Imaging Review Plain radiographs demonstrate severe degenerative joint disease of the right knee(s). The overall alignment isneutral. The bone quality appears to be fair for age and reported activity level.  Assessment/Plan:  End stage arthritis, right knee   The patient history, physical examination, clinical judgment of the provider and imaging studies are consistent with end stage degenerative joint disease of the right knee(s) and total knee arthroplasty is deemed medically necessary. The treatment options including medical management, injection therapy arthroscopy and arthroplasty were discussed at length. The risks and benefits of total knee arthroplasty were presented and reviewed. The risks due to aseptic loosening, infection, stiffness, patella tracking problems, thromboembolic complications and other imponderables were discussed. The patient acknowledged the explanation, agreed to proceed with the plan and consent was signed. Patient is being admitted for inpatient treatment for surgery, pain control, PT, OT, prophylactic antibiotics, VTE prophylaxis, progressive ambulation and ADL's and discharge planning. The patient is planning to be discharged home with home health services

## 2015-12-01 ENCOUNTER — Other Ambulatory Visit (HOSPITAL_COMMUNITY): Payer: Self-pay | Admitting: *Deleted

## 2015-12-01 ENCOUNTER — Encounter (HOSPITAL_COMMUNITY): Payer: Self-pay

## 2015-12-01 ENCOUNTER — Encounter (HOSPITAL_COMMUNITY)
Admission: RE | Admit: 2015-12-01 | Discharge: 2015-12-01 | Disposition: A | Discharge status: Home / Self Care | Payer: Medicare Other | Source: Ambulatory Visit | Attending: Orthopedic Surgery | Admitting: Orthopedic Surgery

## 2015-12-01 DIAGNOSIS — I739 Peripheral vascular disease, unspecified: Secondary | ICD-10-CM | POA: Diagnosis not present

## 2015-12-01 DIAGNOSIS — M1711 Unilateral primary osteoarthritis, right knee: Secondary | ICD-10-CM | POA: Diagnosis not present

## 2015-12-01 DIAGNOSIS — Z7982 Long term (current) use of aspirin: Secondary | ICD-10-CM | POA: Insufficient documentation

## 2015-12-01 DIAGNOSIS — Z87891 Personal history of nicotine dependence: Secondary | ICD-10-CM | POA: Diagnosis not present

## 2015-12-01 DIAGNOSIS — E7800 Pure hypercholesterolemia, unspecified: Secondary | ICD-10-CM | POA: Diagnosis not present

## 2015-12-01 DIAGNOSIS — I428 Other cardiomyopathies: Secondary | ICD-10-CM | POA: Insufficient documentation

## 2015-12-01 DIAGNOSIS — I10 Essential (primary) hypertension: Secondary | ICD-10-CM | POA: Insufficient documentation

## 2015-12-01 DIAGNOSIS — Z01818 Encounter for other preprocedural examination: Secondary | ICD-10-CM | POA: Diagnosis not present

## 2015-12-01 DIAGNOSIS — M797 Fibromyalgia: Secondary | ICD-10-CM | POA: Insufficient documentation

## 2015-12-01 DIAGNOSIS — Z01812 Encounter for preprocedural laboratory examination: Secondary | ICD-10-CM | POA: Diagnosis not present

## 2015-12-01 DIAGNOSIS — I2510 Atherosclerotic heart disease of native coronary artery without angina pectoris: Secondary | ICD-10-CM | POA: Insufficient documentation

## 2015-12-01 DIAGNOSIS — Z0183 Encounter for blood typing: Secondary | ICD-10-CM | POA: Diagnosis not present

## 2015-12-01 DIAGNOSIS — E039 Hypothyroidism, unspecified: Secondary | ICD-10-CM | POA: Insufficient documentation

## 2015-12-01 DIAGNOSIS — Z79899 Other long term (current) drug therapy: Secondary | ICD-10-CM | POA: Insufficient documentation

## 2015-12-01 HISTORY — DX: Reserved for inherently not codable concepts without codable children: IMO0001

## 2015-12-01 HISTORY — DX: Unspecified osteoarthritis, unspecified site: M19.90

## 2015-12-01 HISTORY — DX: Hypothyroidism, unspecified: E03.9

## 2015-12-01 HISTORY — DX: Peripheral vascular disease, unspecified: I73.9

## 2015-12-01 HISTORY — DX: Fibromyalgia: M79.7

## 2015-12-01 LAB — URINALYSIS, ROUTINE W REFLEX MICROSCOPIC
Bilirubin Urine: NEGATIVE
GLUCOSE, UA: NEGATIVE mg/dL
HGB URINE DIPSTICK: NEGATIVE
Ketones, ur: NEGATIVE mg/dL
LEUKOCYTES UA: NEGATIVE
Nitrite: NEGATIVE
PH: 6 (ref 5.0–8.0)
PROTEIN: NEGATIVE mg/dL
SPECIFIC GRAVITY, URINE: 1.025 (ref 1.005–1.030)

## 2015-12-01 LAB — CBC
HEMATOCRIT: 37.5 % (ref 36.0–46.0)
Hemoglobin: 11.5 g/dL — ABNORMAL LOW (ref 12.0–15.0)
MCH: 29.7 pg (ref 26.0–34.0)
MCHC: 30.7 g/dL (ref 30.0–36.0)
MCV: 96.9 fL (ref 78.0–100.0)
Platelets: 237 10*3/uL (ref 150–400)
RBC: 3.87 MIL/uL (ref 3.87–5.11)
RDW: 12.2 % (ref 11.5–15.5)
WBC: 2.9 10*3/uL — AB (ref 4.0–10.5)

## 2015-12-01 LAB — PROTIME-INR
INR: 1.06 (ref 0.00–1.49)
PROTHROMBIN TIME: 14 s (ref 11.6–15.2)

## 2015-12-01 LAB — BASIC METABOLIC PANEL
ANION GAP: 10 (ref 5–15)
BUN: 13 mg/dL (ref 6–20)
CALCIUM: 9.7 mg/dL (ref 8.9–10.3)
CO2: 30 mmol/L (ref 22–32)
Chloride: 104 mmol/L (ref 101–111)
Creatinine, Ser: 0.94 mg/dL (ref 0.44–1.00)
GFR calc Af Amer: >60 mL/min (ref >60)
Glucose, Bld: 103 mg/dL — ABNORMAL HIGH (ref 65–99)
POTASSIUM: 4.6 mmol/L (ref 3.5–5.1)
SODIUM: 144 mmol/L (ref 135–145)

## 2015-12-01 LAB — SURGICAL PCR SCREEN
MRSA, PCR: NEGATIVE
STAPHYLOCOCCUS AUREUS: NEGATIVE

## 2015-12-01 LAB — TYPE AND SCREEN
ABO/RH(D): O POS
Antibody Screen: NEGATIVE

## 2015-12-01 LAB — ABO/RH: ABO/RH(D): O POS

## 2015-12-01 NOTE — Pre-Procedure Instructions (Addendum)
    Tracy Buckley  12/01/2015      [facility] - [place] - [address] [place] [address] Phone: [phone] Fax: [phone]  [id number] - [place], [place] - [address] AT [facility] [address] [place] [address] Phone: [phone] Fax: [phone]    Your procedure is scheduled on 12-12-2015  Tuesday   Report to [facility] Admitting at 5:30 A.M.   Call this number if you have problems the morning of surgery:  [phone]   Remember:  Do not eat food or drink liquids after midnight.   Take these medicines the morning of surgery with A SIP OF WATER Carvedilol(Coreg),Diazepam(Valium),Pain medication if needed,potassium,   Do not wear jewelry, make-up or nail polish.  Do not wear lotions, powders, or perfumes.  You may not wear deodorant.  Do not shave 48 hours prior to surgery.    Do not bring valuables to the hospital.  [facility] is not responsible for any belongings or valuables.  Contacts, dentures or bridgework may not be worn into surgery.  Leave your suitcase in the car.  After surgery it may be brought to your room.  For patients admitted to the hospital, discharge time will be determined by your treatment team.  Patients discharged the day of surgery will not be allowed to drive home.    Special instructions:  See attached Sheet for instructions on CHG showers  Please read over the following fact sheets that you were given. Pain Booklet, Coughing and Deep Breathing, Blood Transfusion Information and Surgical Site Infection Prevention

## 2015-12-04 NOTE — Progress Notes (Addendum)
Anesthesia chart review: Patient is a 65 year old female scheduled for right unicompartmental knee arthroplasty on 12/12/15 by Dr. Margarita Rana.  History includes former smoker, chronic chest pain, nonischemic cardiomyopathy '02, mild non-obstructive CAD by 2013 cath, hypertension, hypercholesterolemia, depression (with hospitalization for suicidal ideation 2001), prior polysubstance abuse (cocaine ~ 2000; denied current), diverticulitis, exertional dyspnea, PVD, arthritis, fibromyalgia, hypothyroidism, tummy tuck '09, back surgery, hysterectomy.   PCP is Dr. Burton Apley.  Cardiologist is Dr. Swaziland who cleared patient for surgery following recent low risk stress test.   Meds include aspirin 81 mg, Coreg, Valium, Norco, MS Contin, nitroglycerin, KCl, Lyrica, ramipril, Zocor, Zanaflex, trazodone.  11/07/15 EKG: NSR, cannot rule out inferior infarct (age undetermined).  11/09/15 Nuclear stress test:   The left ventricular ejection fraction is normal (55-65%).  Nuclear stress EF: 60%.  There was no ST segment deviation noted during stress.  The study is normal.  This is a low risk study.  Normal myocardial perfusion. Normal myocardial perfusion and function; EF 60%.  04/16/12 Echo: Study Conclusions - Left ventricle: The cavity size was normal. Wall thickness was increased in a pattern of mild LVH. Systolic function was normal. The estimated ejection fraction was in the range of 50% to 55%. Wall motion was normal; there were no regional wall motion abnormalities. Doppler parameters are consistent with abnormal left ventricular relaxation (grade 1 diastolic dysfunction). - Mitral valve: Mild regurgitation. - Pulmonary arteries: Systolic pressure was mildly increased. PA peak pressure: 3mm Hg (S).  04/17/12 Cardiac cath: Left main: No obstructive disease. Left Anterior Descending Artery: Large caliber vessel that courses to the apex. There are mild luminal  irregularities but no focal stenosis.  Circumflex Artery: Moderate sized vessel that gives off three small to moderate sized marginal branches that are free of disease. There is a small caliber intermediate branch that appears to have 50% stenosis at the ostium. (This vessel is 1.5 mm.)  Right Coronary Artery: Small to moderate sized dominant vessel with mild luminal irregularities but no focal stenosis.  Left Ventricular Angiogram: LVEF=50%. Impression: 1. Mild, non-obstructive CAD 2. Low normal LV systolic function 3. Non-cardiac chest pain Recommendations: Would manage her non-obstructive CAD with ASA 81 mg po QDaily, statin.  Preoperative labs noted. WBC 2.9 (no differential ordered). H/H 11.5/37.5. PLT 237K. Cr 0.94. Glucose 103. INR 1.06. UA WNL. No reported history of leukopenia, although WBC low at 3.6 on 04/16/12, but WNL on 01/16/13. I requested comparison labs from Dr. Molly Maduro and also routed him a copy of labs from PAT. I'll plan to review additional PCP records if received, but otherwise if no acute changes then I anticipate that he can proceed as planned.   Velna Ochs [facility] Short Stay Center/Anesthesiology Phone [phone] 12/05/2015 2:52 PM  Addendum: Received comparison labs from Dr. Su Hilt. WBC 04/12/15 was 3.6 with normal differential. According to his comments, prior results were 2.6, 2.9, 3.4 which are fairly consistent with her pre-operative labs.  Velna Ochs [facility] Short Stay Center/Anesthesiology Phone [phone] 12/06/2015 2:50 PM

## 2015-12-06 NOTE — Progress Notes (Signed)
Called Dr. Les Pou office requesting most recent lab work, office to be sending lab work today.

## 2015-12-11 MED ORDER — TRANEXAMIC ACID 1000 MG/10ML IV SOLN
2000.0000 mg | INTRAVENOUS | Status: AC
  Administered 2015-12-12: 2000 mg via TOPICAL
  Filled 2015-12-11: qty 20

## 2015-12-11 MED ORDER — ACETAMINOPHEN 500 MG PO TABS
1000.0000 mg | ORAL_TABLET | Freq: Once | ORAL | Status: AC
  Administered 2015-12-12: 1000 mg via ORAL
  Filled 2015-12-11: qty 2

## 2015-12-11 MED ORDER — POTASSIUM CHLORIDE IN NACL 20-0.45 MEQ/L-% IV SOLN
INTRAVENOUS | Status: DC
  Filled 2015-12-11: qty 1000

## 2015-12-11 MED ORDER — CEFAZOLIN SODIUM-DEXTROSE 2-3 GM-% IV SOLR
2.0000 g | INTRAVENOUS | Status: AC
  Administered 2015-12-12: 2 g via INTRAVENOUS
  Filled 2015-12-11: qty 50

## 2015-12-12 ENCOUNTER — Ambulatory Visit (HOSPITAL_COMMUNITY): Payer: Medicare Other | Admitting: Vascular Surgery

## 2015-12-12 ENCOUNTER — Ambulatory Visit (HOSPITAL_COMMUNITY): Payer: Medicare Other | Admitting: Anesthesiology

## 2015-12-12 ENCOUNTER — Encounter (HOSPITAL_COMMUNITY): Payer: Self-pay | Admitting: Certified Registered Nurse Anesthetist

## 2015-12-12 ENCOUNTER — Encounter (HOSPITAL_COMMUNITY)
Admission: AD | Disposition: A | Discharge status: Home Health | Payer: Self-pay | Source: Ambulatory Visit | Attending: Orthopedic Surgery

## 2015-12-12 ENCOUNTER — Inpatient Hospital Stay (HOSPITAL_COMMUNITY): Payer: Medicare Other

## 2015-12-12 ENCOUNTER — Inpatient Hospital Stay (HOSPITAL_COMMUNITY)
Admission: AD | Admit: 2015-12-12 | Discharge: 2015-12-14 | DRG: 470 | Disposition: A | Discharge status: Home Health | Payer: Medicare Other | Source: Ambulatory Visit | Attending: Orthopedic Surgery | Admitting: Orthopedic Surgery

## 2015-12-12 DIAGNOSIS — E039 Hypothyroidism, unspecified: Secondary | ICD-10-CM | POA: Diagnosis present

## 2015-12-12 DIAGNOSIS — M1711 Unilateral primary osteoarthritis, right knee: Secondary | ICD-10-CM | POA: Diagnosis present

## 2015-12-12 DIAGNOSIS — I428 Other cardiomyopathies: Secondary | ICD-10-CM | POA: Diagnosis present

## 2015-12-12 DIAGNOSIS — Z87891 Personal history of nicotine dependence: Secondary | ICD-10-CM | POA: Diagnosis not present

## 2015-12-12 DIAGNOSIS — Z96651 Presence of right artificial knee joint: Secondary | ICD-10-CM

## 2015-12-12 DIAGNOSIS — E782 Mixed hyperlipidemia: Secondary | ICD-10-CM | POA: Diagnosis present

## 2015-12-12 DIAGNOSIS — Z96659 Presence of unspecified artificial knee joint: Secondary | ICD-10-CM

## 2015-12-12 DIAGNOSIS — M797 Fibromyalgia: Secondary | ICD-10-CM | POA: Diagnosis present

## 2015-12-12 DIAGNOSIS — I10 Essential (primary) hypertension: Secondary | ICD-10-CM | POA: Diagnosis present

## 2015-12-12 DIAGNOSIS — M25561 Pain in right knee: Secondary | ICD-10-CM | POA: Diagnosis present

## 2015-12-12 HISTORY — PX: PARTIAL KNEE ARTHROPLASTY: SHX2174

## 2015-12-12 SURGERY — ARTHROPLASTY, KNEE, UNICOMPARTMENTAL
Anesthesia: General | Site: Knee | Laterality: Right

## 2015-12-12 MED ORDER — PROMETHAZINE HCL 25 MG/ML IJ SOLN: 6.2500 mg | INTRAMUSCULAR | Status: DC | PRN

## 2015-12-12 MED ORDER — TRAZODONE HCL 50 MG PO TABS
50.0000 mg | ORAL_TABLET | Freq: Every evening | ORAL | Status: DC | PRN
  Administered 2015-12-12 – 2015-12-13 (×2): 50 mg via ORAL
  Filled 2015-12-12 (×2): qty 1

## 2015-12-12 MED ORDER — CARVEDILOL 3.125 MG PO TABS
3.1250 mg | ORAL_TABLET | Freq: Two times a day (BID) | ORAL | Status: DC
  Administered 2015-12-12 – 2015-12-14 (×4): 3.125 mg via ORAL
  Filled 2015-12-12 (×4): qty 1

## 2015-12-12 MED ORDER — OXYCODONE HCL 5 MG PO TABS
5.0000 mg | ORAL_TABLET | ORAL | Status: DC | PRN
  Administered 2015-12-12 – 2015-12-14 (×11): 10 mg via ORAL
  Filled 2015-12-12 (×10): qty 2

## 2015-12-12 MED ORDER — DIAZEPAM 5 MG PO TABS
5.0000 mg | ORAL_TABLET | Freq: Every day | ORAL | Status: DC
  Administered 2015-12-12 – 2015-12-14 (×3): 5 mg via ORAL
  Filled 2015-12-12 (×2): qty 1

## 2015-12-12 MED ORDER — SODIUM CHLORIDE 0.9 % IR SOLN
Status: DC | PRN
  Administered 2015-12-12: 3000 mL

## 2015-12-12 MED ORDER — ONDANSETRON HCL 4 MG/2ML IJ SOLN: 4.0000 mg | Freq: Four times a day (QID) | INTRAMUSCULAR | Status: DC | PRN

## 2015-12-12 MED ORDER — DIAZEPAM 5 MG PO TABS
ORAL_TABLET | ORAL | Status: AC
  Filled 2015-12-12: qty 1

## 2015-12-12 MED ORDER — PHENOL 1.4 % MT LIQD
1.0000 | OROMUCOSAL | Status: DC | PRN
Start: 2015-12-12 — End: 2015-12-14

## 2015-12-12 MED ORDER — STERILE WATER FOR INJECTION IJ SOLN
INTRAMUSCULAR | Status: AC
  Filled 2015-12-12: qty 10

## 2015-12-12 MED ORDER — ONDANSETRON HCL 4 MG/2ML IJ SOLN
INTRAMUSCULAR | Status: DC | PRN
  Administered 2015-12-12: 4 mg via INTRAVENOUS

## 2015-12-12 MED ORDER — MIDAZOLAM HCL 5 MG/5ML IJ SOLN
INTRAMUSCULAR | Status: DC | PRN
  Administered 2015-12-12 (×2): 1 mg via INTRAVENOUS

## 2015-12-12 MED ORDER — CHLORHEXIDINE GLUCONATE 4 % EX LIQD: 60.0000 mL | Freq: Once | CUTANEOUS | Status: DC

## 2015-12-12 MED ORDER — FENTANYL CITRATE (PF) 250 MCG/5ML IJ SOLN
INTRAMUSCULAR | Status: AC
  Filled 2015-12-12: qty 5

## 2015-12-12 MED ORDER — ASPIRIN 325 MG PO TABS: 325.0000 mg | ORAL_TABLET | Freq: Every day | ORAL | Status: DC

## 2015-12-12 MED ORDER — HYDROMORPHONE HCL 1 MG/ML IJ SOLN
INTRAMUSCULAR | Status: AC
  Filled 2015-12-12: qty 1

## 2015-12-12 MED ORDER — ROCURONIUM BROMIDE 50 MG/5ML IV SOLN
INTRAVENOUS | Status: AC
  Filled 2015-12-12: qty 1

## 2015-12-12 MED ORDER — PHENYLEPHRINE 40 MCG/ML (10ML) SYRINGE FOR IV PUSH (FOR BLOOD PRESSURE SUPPORT)
PREFILLED_SYRINGE | INTRAVENOUS | Status: AC
  Filled 2015-12-12: qty 10

## 2015-12-12 MED ORDER — CEFAZOLIN SODIUM-DEXTROSE 2-3 GM-% IV SOLR
2.0000 g | Freq: Four times a day (QID) | INTRAVENOUS | Status: AC
  Administered 2015-12-12 (×2): 2 g via INTRAVENOUS
  Filled 2015-12-12 (×2): qty 50

## 2015-12-12 MED ORDER — GLYCOPYRROLATE 0.2 MG/ML IJ SOLN
INTRAMUSCULAR | Status: AC
  Filled 2015-12-12: qty 2

## 2015-12-12 MED ORDER — ONDANSETRON HCL 4 MG PO TABS: 4.0000 mg | ORAL_TABLET | Freq: Three times a day (TID) | ORAL | Status: DC | PRN

## 2015-12-12 MED ORDER — MIDAZOLAM HCL 2 MG/2ML IJ SOLN
INTRAMUSCULAR | Status: AC
  Filled 2015-12-12: qty 2

## 2015-12-12 MED ORDER — ONDANSETRON HCL 4 MG/2ML IJ SOLN
INTRAMUSCULAR | Status: AC
  Filled 2015-12-12: qty 2

## 2015-12-12 MED ORDER — NEOSTIGMINE METHYLSULFATE 10 MG/10ML IV SOLN
INTRAVENOUS | Status: DC | PRN
  Administered 2015-12-12: 4 mg via INTRAVENOUS

## 2015-12-12 MED ORDER — ACETAMINOPHEN 325 MG PO TABS
ORAL_TABLET | ORAL | Status: AC
  Filled 2015-12-12: qty 3

## 2015-12-12 MED ORDER — PHENYLEPHRINE HCL 10 MG/ML IJ SOLN
INTRAMUSCULAR | Status: DC | PRN
  Administered 2015-12-12 (×3): 80 ug via INTRAVENOUS

## 2015-12-12 MED ORDER — POTASSIUM CHLORIDE CRYS ER 20 MEQ PO TBCR
20.0000 meq | EXTENDED_RELEASE_TABLET | Freq: Every day | ORAL | Status: DC
  Administered 2015-12-13 – 2015-12-14 (×2): 20 meq via ORAL
  Filled 2015-12-12 (×2): qty 1

## 2015-12-12 MED ORDER — FENTANYL CITRATE (PF) 100 MCG/2ML IJ SOLN
INTRAMUSCULAR | Status: DC | PRN
  Administered 2015-12-12 (×2): 50 ug via INTRAVENOUS
  Administered 2015-12-12: 100 ug via INTRAVENOUS
  Administered 2015-12-12: 50 ug via INTRAVENOUS

## 2015-12-12 MED ORDER — POLYETHYLENE GLYCOL 3350 17 G PO PACK: 17.0000 g | PACK | Freq: Every day | ORAL | Status: DC | PRN

## 2015-12-12 MED ORDER — LACTATED RINGERS IV SOLN
INTRAVENOUS | Status: DC | PRN
  Administered 2015-12-12 (×2): via INTRAVENOUS

## 2015-12-12 MED ORDER — PREGABALIN 75 MG PO CAPS
75.0000 mg | ORAL_CAPSULE | Freq: Every day | ORAL | Status: DC
  Administered 2015-12-12 – 2015-12-13 (×2): 75 mg via ORAL
  Filled 2015-12-12 (×2): qty 1

## 2015-12-12 MED ORDER — HYDROMORPHONE HCL 1 MG/ML IJ SOLN
0.2500 mg | INTRAMUSCULAR | Status: DC | PRN
  Administered 2015-12-12: 1 mg via INTRAVENOUS
  Administered 2015-12-12 (×2): 0.5 mg via INTRAVENOUS

## 2015-12-12 MED ORDER — LEVOTHYROXINE SODIUM 75 MCG PO TABS
75.0000 ug | ORAL_TABLET | Freq: Every day | ORAL | Status: DC
  Administered 2015-12-13 – 2015-12-14 (×2): 75 ug via ORAL
  Filled 2015-12-12 (×2): qty 1

## 2015-12-12 MED ORDER — PROPOFOL 10 MG/ML IV BOLUS
INTRAVENOUS | Status: DC | PRN
  Administered 2015-12-12: 150 mg via INTRAVENOUS

## 2015-12-12 MED ORDER — OXYCODONE HCL 5 MG PO TABS
ORAL_TABLET | ORAL | Status: AC
  Filled 2015-12-12: qty 2

## 2015-12-12 MED ORDER — SODIUM CHLORIDE 0.45 % IV SOLN
INTRAVENOUS | Status: DC
  Administered 2015-12-12 – 2015-12-13 (×2): via INTRAVENOUS

## 2015-12-12 MED ORDER — CELECOXIB 200 MG PO CAPS
200.0000 mg | ORAL_CAPSULE | Freq: Two times a day (BID) | ORAL | Status: DC
  Administered 2015-12-12 – 2015-12-14 (×5): 200 mg via ORAL
  Filled 2015-12-12 (×5): qty 1

## 2015-12-12 MED ORDER — SIMVASTATIN 20 MG PO TABS
20.0000 mg | ORAL_TABLET | Freq: Every evening | ORAL | Status: DC
  Administered 2015-12-12 – 2015-12-13 (×2): 20 mg via ORAL
  Filled 2015-12-12 (×2): qty 1

## 2015-12-12 MED ORDER — ACETAMINOPHEN 325 MG PO TABS
650.0000 mg | ORAL_TABLET | Freq: Four times a day (QID) | ORAL | Status: DC | PRN
  Administered 2015-12-12: 650 mg via ORAL
  Filled 2015-12-12: qty 2

## 2015-12-12 MED ORDER — EPHEDRINE SULFATE 50 MG/ML IJ SOLN
INTRAMUSCULAR | Status: AC
  Filled 2015-12-12: qty 1

## 2015-12-12 MED ORDER — RAMIPRIL 10 MG PO CAPS
10.0000 mg | ORAL_CAPSULE | Freq: Every day | ORAL | Status: DC
  Administered 2015-12-13 – 2015-12-14 (×2): 10 mg via ORAL
  Filled 2015-12-12 (×2): qty 1

## 2015-12-12 MED ORDER — ACETAMINOPHEN 500 MG PO TABS
1000.0000 mg | ORAL_TABLET | Freq: Once | ORAL | Status: AC
  Administered 2015-12-12: 975 mg via ORAL

## 2015-12-12 MED ORDER — TIZANIDINE HCL 4 MG PO TABS
4.0000 mg | ORAL_TABLET | Freq: Every evening | ORAL | Status: DC | PRN
  Administered 2015-12-12 – 2015-12-13 (×2): 4 mg via ORAL
  Filled 2015-12-12 (×2): qty 1

## 2015-12-12 MED ORDER — METOCLOPRAMIDE HCL 5 MG/ML IJ SOLN: 5.0000 mg | Freq: Three times a day (TID) | INTRAMUSCULAR | Status: DC | PRN

## 2015-12-12 MED ORDER — ONDANSETRON HCL 4 MG PO TABS: 4.0000 mg | ORAL_TABLET | Freq: Four times a day (QID) | ORAL | Status: DC | PRN

## 2015-12-12 MED ORDER — NEOSTIGMINE METHYLSULFATE 10 MG/10ML IV SOLN
INTRAVENOUS | Status: AC
  Filled 2015-12-12: qty 1

## 2015-12-12 MED ORDER — LIDOCAINE HCL (CARDIAC) 20 MG/ML IV SOLN
INTRAVENOUS | Status: DC | PRN
  Administered 2015-12-12: 100 mg via INTRAVENOUS

## 2015-12-12 MED ORDER — DEXAMETHASONE SODIUM PHOSPHATE 10 MG/ML IJ SOLN
10.0000 mg | Freq: Once | INTRAMUSCULAR | Status: AC
  Administered 2015-12-13: 10 mg via INTRAVENOUS
  Filled 2015-12-12: qty 1

## 2015-12-12 MED ORDER — ACETAMINOPHEN 650 MG RE SUPP: 650.0000 mg | Freq: Four times a day (QID) | RECTAL | Status: DC | PRN

## 2015-12-12 MED ORDER — LIDOCAINE HCL (CARDIAC) 20 MG/ML IV SOLN
INTRAVENOUS | Status: AC
  Filled 2015-12-12: qty 5

## 2015-12-12 MED ORDER — BUPIVACAINE-EPINEPHRINE (PF) 0.5% -1:200000 IJ SOLN
INTRAMUSCULAR | Status: DC | PRN
  Administered 2015-12-12: 30 mL via PERINEURAL

## 2015-12-12 MED ORDER — DOCUSATE SODIUM 100 MG PO CAPS
100.0000 mg | ORAL_CAPSULE | Freq: Two times a day (BID) | ORAL | Status: DC
  Administered 2015-12-12 – 2015-12-14 (×5): 100 mg via ORAL
  Filled 2015-12-12 (×5): qty 1

## 2015-12-12 MED ORDER — ASPIRIN EC 325 MG PO TBEC
325.0000 mg | DELAYED_RELEASE_TABLET | Freq: Every day | ORAL | Status: DC
  Administered 2015-12-13 – 2015-12-14 (×2): 325 mg via ORAL
  Filled 2015-12-12 (×2): qty 1

## 2015-12-12 MED ORDER — METOCLOPRAMIDE HCL 5 MG PO TABS: 5.0000 mg | ORAL_TABLET | Freq: Three times a day (TID) | ORAL | Status: DC | PRN

## 2015-12-12 MED ORDER — 0.9 % SODIUM CHLORIDE (POUR BTL) OPTIME
TOPICAL | Status: DC | PRN
  Administered 2015-12-12: 1000 mL

## 2015-12-12 MED ORDER — HYDROMORPHONE HCL 1 MG/ML IJ SOLN
1.0000 mg | INTRAMUSCULAR | Status: DC | PRN
  Administered 2015-12-12 – 2015-12-14 (×10): 1 mg via INTRAVENOUS
  Filled 2015-12-12 (×10): qty 1

## 2015-12-12 MED ORDER — ROCURONIUM BROMIDE 100 MG/10ML IV SOLN
INTRAVENOUS | Status: DC | PRN
  Administered 2015-12-12: 50 mg via INTRAVENOUS

## 2015-12-12 MED ORDER — MORPHINE SULFATE ER 30 MG PO TBCR
30.0000 mg | EXTENDED_RELEASE_TABLET | Freq: Two times a day (BID) | ORAL | Status: DC
  Administered 2015-12-12 – 2015-12-14 (×5): 30 mg via ORAL
  Filled 2015-12-12 (×5): qty 1

## 2015-12-12 MED ORDER — SUCCINYLCHOLINE CHLORIDE 20 MG/ML IJ SOLN
INTRAMUSCULAR | Status: AC
  Filled 2015-12-12: qty 1

## 2015-12-12 MED ORDER — GLYCOPYRROLATE 0.2 MG/ML IJ SOLN
INTRAMUSCULAR | Status: DC | PRN
  Administered 2015-12-12: 0.6 mg via INTRAVENOUS

## 2015-12-12 MED ORDER — MENTHOL 3 MG MT LOZG: 1.0000 | LOZENGE | OROMUCOSAL | Status: DC | PRN

## 2015-12-12 MED ORDER — DOCUSATE SODIUM 100 MG PO CAPS: 100.0000 mg | ORAL_CAPSULE | Freq: Two times a day (BID) | ORAL | Status: DC

## 2015-12-12 MED ORDER — PROPOFOL 10 MG/ML IV BOLUS
INTRAVENOUS | Status: AC
  Filled 2015-12-12: qty 20

## 2015-12-12 MED ORDER — GLYCOPYRROLATE 0.2 MG/ML IJ SOLN
INTRAMUSCULAR | Status: AC
Start: 2015-12-12 — End: 2015-12-12
  Filled 2015-12-12: qty 3

## 2015-12-12 SURGICAL SUPPLY — 54 items
BANDAGE ESMARK 6X9 LF (GAUZE/BANDAGES/DRESSINGS) ×1 IMPLANT
BNDG CMPR 9X6 STRL LF SNTH (GAUZE/BANDAGES/DRESSINGS) ×1
BNDG ESMARK 6X9 LF (GAUZE/BANDAGES/DRESSINGS) ×3
BONE CEMENT PALACOSE (Orthopedic Implant) ×3 IMPLANT
BOWL SMART MIX CTS (DISPOSABLE) ×3 IMPLANT
CAPT KNEE PARTIAL 2 ×2 IMPLANT
CEMENT BONE PALACOSE (Orthopedic Implant) ×1 IMPLANT
CHLORAPREP W/TINT 26ML (MISCELLANEOUS) ×3 IMPLANT
CLOSURE STERI-STRIP 1/2X4 (GAUZE/BANDAGES/DRESSINGS) ×1
CLSR STERI-STRIP ANTIMIC 1/2X4 (GAUZE/BANDAGES/DRESSINGS) ×2 IMPLANT
COVER SURGICAL LIGHT HANDLE (MISCELLANEOUS) ×3 IMPLANT
CUFF TOURNIQUET SINGLE 34IN LL (TOURNIQUET CUFF) ×3 IMPLANT
DRAPE EXTREMITY T 121X128X90 (DRAPE) ×3 IMPLANT
DRAPE IMP U-DRAPE 54X76 (DRAPES) ×6 IMPLANT
DRAPE PROXIMA HALF (DRAPES) ×3 IMPLANT
DRAPE U-SHAPE 47X51 STRL (DRAPES) ×3 IMPLANT
DRSG MEPILEX BORDER 4X4 (GAUZE/BANDAGES/DRESSINGS) IMPLANT
DRSG MEPILEX BORDER 4X8 (GAUZE/BANDAGES/DRESSINGS) ×3 IMPLANT
ELECT CAUTERY BLADE 6.4 (BLADE) ×3 IMPLANT
ELECT REM PT RETURN 9FT ADLT (ELECTROSURGICAL) ×3
ELECTRODE REM PT RTRN 9FT ADLT (ELECTROSURGICAL) ×1 IMPLANT
EVACUATOR 1/8 PVC DRAIN (DRAIN) IMPLANT
FACESHIELD WRAPAROUND (MASK) ×6 IMPLANT
FACESHIELD WRAPAROUND OR TEAM (MASK) ×2 IMPLANT
GLOVE BIO SURGEON STRL SZ7 (GLOVE) ×3 IMPLANT
GLOVE BIO SURGEON STRL SZ7.5 (GLOVE) ×3 IMPLANT
GLOVE BIOGEL PI IND STRL 7.0 (GLOVE) ×1 IMPLANT
GLOVE BIOGEL PI IND STRL 8 (GLOVE) ×1 IMPLANT
GLOVE BIOGEL PI INDICATOR 7.0 (GLOVE) ×2
GLOVE BIOGEL PI INDICATOR 8 (GLOVE) ×2
GLOVE SURG SS PI 8.0 STRL IVOR (GLOVE) ×4 IMPLANT
GOWN STRL REUS W/ TWL LRG LVL3 (GOWN DISPOSABLE) ×1 IMPLANT
GOWN STRL REUS W/ TWL XL LVL3 (GOWN DISPOSABLE) ×1 IMPLANT
GOWN STRL REUS W/TWL LRG LVL3 (GOWN DISPOSABLE) ×3
GOWN STRL REUS W/TWL XL LVL3 (GOWN DISPOSABLE) ×6
HANDPIECE INTERPULSE COAX TIP (DISPOSABLE) ×3
IMMOBILIZER KNEE 22 UNIV (SOFTGOODS) ×3 IMPLANT
KIT BASIN OR (CUSTOM PROCEDURE TRAY) ×3 IMPLANT
KIT ROOM TURNOVER OR (KITS) ×3 IMPLANT
MANIFOLD NEPTUNE II (INSTRUMENTS) ×3 IMPLANT
NS IRRIG 1000ML POUR BTL (IV SOLUTION) ×3 IMPLANT
PACK TOTAL JOINT (CUSTOM PROCEDURE TRAY) ×3 IMPLANT
PACK UNIVERSAL I (CUSTOM PROCEDURE TRAY) ×3 IMPLANT
PAD ARMBOARD 7.5X6 YLW CONV (MISCELLANEOUS) ×3 IMPLANT
SET HNDPC FAN SPRY TIP SCT (DISPOSABLE) ×1 IMPLANT
STAPLER VISISTAT 35W (STAPLE) IMPLANT
SUCTION FRAZIER HANDLE 10FR (MISCELLANEOUS) ×2
SUCTION TUBE FRAZIER 10FR DISP (MISCELLANEOUS) ×1 IMPLANT
SUT MNCRL AB 4-0 PS2 18 (SUTURE) IMPLANT
SUT MON AB 2-0 CT1 27 (SUTURE) ×6 IMPLANT
SUT VIC AB 1 CTX 36 (SUTURE) ×6
SUT VIC AB 1 CTX36XBRD ANBCTR (SUTURE) ×2 IMPLANT
TOWEL OR 17X24 6PK STRL BLUE (TOWEL DISPOSABLE) ×3 IMPLANT
TOWEL OR 17X26 10 PK STRL BLUE (TOWEL DISPOSABLE) ×3 IMPLANT

## 2015-12-12 NOTE — Evaluation (Signed)
Physical Therapy Evaluation Patient Details Name: Tracy Buckley MRN: [id number] DOB: 08-31-1951 Today's Date: 12/12/2015   History of Present Illness  Admitted for R Unicompartmental Knee;  has a past medical history of Nonischemic cardiomyopathy (HCC); Chronic chest pain; Essential hypertension, benign; Hypercholesteremia; Diverticulitis; Depression; Polysubstance abuse; Obesity; Peripheral vascular disease (HCC); Shortness of breath dyspnea (with exertion); Hypothyroidism; Arthritis; and Fibromyalgia.  has past surgical history that includes Back surgery; Tummy tuck (2009); Hand surgery; Hysterectomy - unknown type; Cesarean section; Shoulder surgery; Kidney stone surgery; left heart catheterization with coronary angiogram (N/A, 04/17/2012); and Abdominal hysterectomy.  Clinical Impression   Patient is s/p above surgery resulting in functional limitations due to the deficits listed below (see PT Problem List).  Patient will benefit from skilled PT to increase their independence and safety with mobility to allow discharge to the venue listed below.       Follow Up Recommendations Home health PT    Equipment Recommendations  Rolling walker with 5" wheels;3in1 (PT)    Recommendations for Other Services OT consult     Precautions / Restrictions Precautions Precautions: Knee;Fall Precaution Comments: Will need more knee education; too distracted by bathroom needs and pain to educate today Restrictions Weight Bearing Restrictions: Yes RLE Weight Bearing: Weight bearing as tolerated      Mobility  Bed Mobility Overal bed mobility: Needs Assistance Bed Mobility: Supine to Sit     Supine to sit: Min guard     General bed mobility comments: Initiated move to EOB impulsively; Cues for safety   Transfers Overall transfer level: Needs assistance Equipment used: Rolling walker (2 wheeled) Transfers: Sit to/from Stand Sit to Stand: Min guard         General transfer comment: Cues  for hand placemnt and safety; Tending to keep weight off of RLE  Ambulation/Gait Ambulation/Gait assistance: Min assist Ambulation Distance (Feet):  (pivotal steps bed to [facility] to recliner) Assistive device: Rolling walker (2 wheeled)       General Gait Details: Keeping weight off of RLE due to pain, tending to only touch forefoot to the floor; noted some R knee buckling as well, but with adequate support from UEs on RW; min assist for safety and RW management  Stairs            Wheelchair Mobility    Modified Rankin (Stroke Patients Only)       Balance                                             Pertinent Vitals/Pain Pain Assessment: 0-10 Pain Score: 10-Worst pain ever Pain Location: R knee Pain Descriptors / Indicators: Aching Pain Intervention(s): Limited activity within patient's tolerance;Monitored during session;Patient requesting pain meds-RN notified    Home Living Family/patient expects to be discharged to:: Private residence Living Arrangements: Other (Comment) (Granddaughter) Available Help at Discharge: Family;Available PRN/intermittently Type of Home: Apartment Home Access: Stairs to enter Entrance Stairs-Rails: Right;Left;Can reach both Entrance Stairs-Number of Steps: 8 Home Layout: One level Home Equipment: None      Prior Function Level of Independence: Independent               Hand Dominance        Extremity/Trunk Assessment   Upper Extremity Assessment: Overall WFL for tasks assessed           Lower Extremity Assessment: RLE deficits/detail RLE Deficits /  Details: Grossly decr AROM and strength, limited by pain postop; Decr R knee control in stance       Communication   Communication: No difficulties  Cognition Arousal/Alertness: Awake/alert Behavior During Therapy: WFL for tasks assessed/performed;Impulsive Overall Cognitive Status: Within Functional Limits for tasks assessed (but very internally  distracted)                      General Comments      Exercises        Assessment/Plan    PT Assessment Patient needs continued PT services  PT Diagnosis Difficulty walking;Acute pain   PT Problem List Decreased strength;Decreased range of motion;Decreased activity tolerance;Decreased balance;Decreased mobility;Decreased knowledge of use of DME;Decreased safety awareness;Decreased knowledge of precautions;Pain  PT Treatment Interventions DME instruction;Gait training;Stair training;Functional mobility training;Therapeutic activities;Therapeutic exercise;Balance training;Patient/family education   PT Goals (Current goals can be found in the Care Plan section) Acute Rehab PT Goals Patient Stated Goal: did not state PT Goal Formulation: Patient unable to participate in goal setting Time For Goal Achievement: 12/19/15 Potential to Achieve Goals: Good    Frequency 7X/week   Barriers to discharge Decreased caregiver support Must be modified independent to dc home    Co-evaluation               End of Session Equipment Utilized During Treatment: Gait belt Activity Tolerance: Patient limited by pain Patient left: in chair;with call bell/phone within reach;with nursing/sitter in room Nurse Communication: Mobility status         Time: 1610-9604 PT Time Calculation (min) (ACUTE ONLY): 16 min   Charges:   PT Evaluation $PT Eval Moderate Complexity: 1 Procedure     PT G CodesOlen Pel 12/12/2015, 4:21 PM  Van Clines, [place]  Acute Rehabilitation Services Pager [phone] Office [phone]

## 2015-12-12 NOTE — Anesthesia Preprocedure Evaluation (Addendum)
Anesthesia Evaluation  Patient identified by MRN, date of birth, ID band Patient awake    Reviewed: Allergy & Precautions, NPO status , Patient's Chart, lab work & pertinent test results, reviewed documented beta blocker date and time   Airway Mallampati: II  TM Distance: >3 FB Neck ROM: Full    Dental  (+) Teeth Intact, Dental Advisory Given, Partial Upper, Partial Lower   Pulmonary shortness of breath and with exertion, former smoker,    Pulmonary exam normal breath sounds clear to auscultation       Cardiovascular hypertension, Pt. on medications and Pt. on home beta blockers + angina + Peripheral Vascular Disease  Normal cardiovascular exam Rhythm:Regular Rate:Normal  No obstructive disease at catheterization 2002 - Dr. [name] and in July of 2013. EF is 50 to 55%. Grade 1 diastolic dysfunction noted on echo 04/2012   Neuro/Psych PSYCHIATRIC DISORDERS Depression negative neurological ROS     GI/Hepatic negative GI ROS, Neg liver ROS,   Endo/Other  Hypothyroidism Obesity   Renal/GU negative Renal ROS     Musculoskeletal negative musculoskeletal ROS (+) Arthritis , Fibromyalgia -, narcotic dependent  Abdominal   Peds  Hematology  (+) Blood dyscrasia, anemia ,   Anesthesia Other Findings Day of surgery medications reviewed with the patient.  Reproductive/Obstetrics                          Anesthesia Physical Anesthesia Plan  ASA: II  Anesthesia Plan: General   Post-op Pain Management:    Induction: Intravenous  Airway Management Planned: Oral ETT  Additional Equipment:   Intra-op Plan:   Post-operative Plan: Extubation in OR  Informed Consent: I have reviewed the patients History and Physical, chart, labs and discussed the procedure including the risks, benefits and alternatives for the proposed anesthesia with the patient or authorized representative who has indicated his/her  understanding and acceptance.   Dental advisory given  Plan Discussed with: CRNA, Anesthesiologist and Surgeon  Anesthesia Plan Comments: (Discussed risks and benefits of and differences between spinal and general. Discussed risks of spinal including headache, backache, failure, bleeding, infection, and nerve damage. Patient consents to spinal. Questions answered. Coagulation studies and platelet count acceptable.)       Anesthesia Quick Evaluation

## 2015-12-12 NOTE — Progress Notes (Signed)
Orthopedic Tech Progress Note Patient Details:  Tracy Buckley Jul 24, 1951 [id number]  CPM Right Knee CPM Right Knee: On Right Knee Flexion (Degrees): 90 Right Knee Extension (Degrees): 0 Additional Comments: Trapeze bar and foot roll   Saul Fordyce 12/12/2015, 10:25 AM

## 2015-12-12 NOTE — Care Management Note (Addendum)
Case Management Note  Patient Details  Name: Tracy Buckley MRN: [id number] Date of Birth: Jul 14, 1951  Subjective/Objective:   65 yr old female who underwent a right unicompartmental knee.                 Action/Plan: Case manager spoke with patient at the bedside  concerning discharge plans and DME needs. Choice was offered,  Patient was preoperatively setup with [facility], no changes. Patient states her granddaughter will be assisting her at discharge. CPM will be delivered to patient's home at discharge, RW and 3in1 will be delivered to patient's room.  Expected Discharge Date:   3/ 1/17              Expected Discharge Plan:  Home w Home Health Services  In-House Referral:     Discharge planning Services  CM Consult  Post Acute Care Choice:    Choice offered to:     DME Arranged:   RW, 3in1, CPM DME Agency:    [facility]  HH Arranged:  PT HH Agency:  [facility]  Status of Service:  In process, will continue to follow  Medicare Important Message Given:    Date Medicare IM Given:    Medicare IM give by:    Date Additional Medicare IM Given:    Additional Medicare Important Message give by:     If discussed at Long Length of Stay Meetings, dates discussed:    Additional Comments: Patient will be staying with her granddaughter at: [address], [place], [place]     Durenda Guthrie, [place] 12/12/2015, 1:46 PM

## 2015-12-12 NOTE — Clinical Social Work Note (Signed)
CSW received referral for SNF.  Case discussed with case manager, and plan is to discharge home with home health.  CSW to sign off please re-consult if social work needs arise.  Jonie Burdell R. Miracle Criado, MSW, LCSWA [phone]  

## 2015-12-12 NOTE — Op Note (Signed)
12/12/2015  9:16 AM  PATIENT:  Tracy Buckley    PRE-OPERATIVE DIAGNOSIS:  OA RIGHT KNEE  POST-OPERATIVE DIAGNOSIS:  Same  PROCEDURE:  RIGHT UNICOMPARTMENTAL KNEE  SURGEON:  Cordell Coke, Jewel Baize, MD  PHYSICIAN ASSISTANT: Janalee Dane, PA-C, She was present and scrubbed throughout the case, critical for completion in a timely fashion, and for retraction, instrumentation, and closure.   ANESTHESIA:   General  PREOPERATIVE INDICATIONS:  Tracy Buckley is a  65 y.o. female with a diagnosis of OA RIGHT KNEE who failed conservative measures and elected for surgical management.    The risks benefits and alternatives were discussed with the patient preoperatively including but not limited to the risks of infection, bleeding, nerve injury, cardiopulmonary complications, blood clots, the need for revision surgery, among others, and the patient was willing to proceed.  OPERATIVE IMPLANTS: Biomet Oxford mobile bearing medial compartment arthroplasty. Femoral Component: small. Tibial tray: A, Size 3 poly.   OPERATIVE FINDINGS: Endstage grade 4 medial compartment osteoarthritis. No significant changes in the lateral or patellofemoral joint  OPERATIVE PROCEDURE: The patient was brought to the operating room placed in supine position. General anesthesia was administered. IV antibiotics were given. The lower extremity was placed in the legholder and prepped and draped in usual sterile fashion.  Time out was performed.  The leg was elevated and exsanguinated and the tourniquet was inflated. Anteromedial incision was performed, and I took care to preserve the MCL. Parapatellar incision was carried out, and the osteophytes were excised, along with the medial meniscus and a small portion of the fat pad.  The extra medullary tibial cutting jig was applied, using the spoon and the 4mm G-Clamp, and I took care to protect the anterior cruciate ligament insertion and the tibial spine. The medial collateral  ligament was also protected, and I resected my proximal tibia, matching the anatomic slope.   The proximal tibial bony cut was removed in one piece, and I turned my attention to the femur.  The intramedullary femoral rod was placed using the drill, and then using the appropriate reference, I assembled the femoral jig, setting my posterior cutting block. I resected my posterior femur, and then measured my gap.   I then used the mill to match the extension gap to the flexion gap. The gaps were then measured again with the appropriate feeler gauges. Once I had balanced flexion and extension gaps, I then completed the preparation of the femur.  I milled off the anterior aspect of the distal femur to prevent impingement. I also exposed the tibia, and selected the above-named component, and then used the cutting jig to prepare the keel slot on the tibia. I also used the awl to curette out the bone to complete the preparation of the keel. The back wall was intact.  I then placed trial components, and it was found to have excellent motion, and appropriate balance.  I then cemented the components into place, cementing the tibia first, removing all excess cement, and then cementing the femur.  All loose cement was removed.  The real polyethylene insert was applied manually, and the knee was taken through functional range of motion, and found to have excellent stability and restoration of joint motion, with excellent balance.  The wounds were irrigated copiously, and the parapatellar tissue closed with Vicryl, followed by Vicryl for the subcutaneous tissue, with routine closure with Steri-Strips and sterile gauze.  The tourniquet was released, and the patient was awakened and extubated and returned to PACU  in stable and satisfactory condition. There were no complications.  POSTOPERATIVE PLAN: DVT px will consist of SCD's and ASA 325, WBAT     Sheral Apley, MD  This note was generated using a  template and dragon dictation system. In light of that, I have reviewed the note and all aspects of it are applicable to this case. Any dictation errors are due to the computerized dictation system.

## 2015-12-12 NOTE — Interval H&P Note (Signed)
History and Physical Interval Note:  12/12/2015 7:03 AM  Tracy Buckley  has presented today for surgery, with the diagnosis of OA RIGHT KNEE  The various methods of treatment have been discussed with the patient and family. After consideration of risks, benefits and other options for treatment, the patient has consented to  Procedure(s): RIGHT UNICOMPARTMENTAL KNEE (Right) as a surgical intervention .  The patient's history has been reviewed, patient examined, no change in status, stable for surgery.  I have reviewed the patient's chart and labs.  Questions were answered to the patient's satisfaction.     [name]

## 2015-12-12 NOTE — Progress Notes (Signed)
Orthopedic Tech Progress Note Patient Details:  Tracy Buckley 1951/06/28 [id number] Ortho visit put on cpm at 1905 Patient ID: Tracy Buckley, female   DOB: 09-Apr-1951, 65 y.o.   MRN: [id number]   Tracy Buckley 12/12/2015, 7:09 PM

## 2015-12-12 NOTE — Transfer of Care (Signed)
Immediate Anesthesia Transfer of Care Note  Patient: Genowefa R Geerdes  Procedure(s) Performed: Procedure(s): RIGHT UNICOMPARTMENTAL KNEE (Right)  Patient Location: PACU  Anesthesia Type:General and Regional  Level of Consciousness: awake, alert  and oriented  Airway & Oxygen Therapy: Patient Spontanous Breathing and Patient connected to nasal cannula oxygen  Post-op Assessment: Report given to RN, Post -op Vital signs reviewed and stable and Patient moving all extremities X 4  Post vital signs: Reviewed and stable  Last Vitals:  Filed Vitals:   12/12/15 0646 12/12/15 0932  BP: 156/92   Pulse: 72 84  Temp: 36.4 C 36.4 C  Resp: 16 17    Complications: No apparent anesthesia complications

## 2015-12-12 NOTE — Anesthesia Procedure Notes (Addendum)
Procedure Name: Intubation Date/Time: 12/12/2015 7:39 AM Performed by: Rise Patience T Pre-anesthesia Checklist: Patient identified, Emergency Drugs available, Suction available and Patient being monitored Patient Re-evaluated:Patient Re-evaluated prior to inductionOxygen Delivery Method: Circle system utilized Preoxygenation: Pre-oxygenation with 100% oxygen Intubation Type: IV induction Ventilation: Mask ventilation without difficulty and Oral airway inserted - appropriate to patient size Laryngoscope Size: Hyacinth Meeker and 2 Grade View: Grade I Tube type: Oral Tube size: 7.5 mm Number of attempts: 1 Airway Equipment and Method: Stylet and Oral airway Placement Confirmation: ETT inserted through vocal cords under direct vision,  positive ETCO2 and breath sounds checked- equal and bilateral Secured at: 22 cm Tube secured with: Tape Dental Injury: Teeth and Oropharynx as per pre-operative assessment     Anesthesia Regional Block:  Femoral nerve block  Pre-Anesthetic Checklist: ,, timeout performed, Correct Patient, Correct Site, Correct Laterality, Correct Procedure, Correct Position, site marked, Risks and benefits discussed,  Surgical consent,  Pre-op evaluation,  At surgeon's request and post-op pain management  Laterality: Right  Prep: chloraprep       Needles:  Injection technique: Single-shot  Needle Type: Echogenic Needle     Needle Length: 9cm 9 cm Needle Gauge: 21 and 21 G    Additional Needles:  Procedures: ultrasound guided (picture in chart) Femoral nerve block Narrative:  Start time: 12/12/2015 7:23 AM End time: 12/12/2015 7:25 AM Injection made incrementally with aspirations every 5 mL.  Performed by: Personally  Anesthesiologist: Cecile Hearing  Additional Notes: No pain on injection. No increased resistance to injection. Injection made in 5cc increments.  Good needle visualization.  Patient tolerated procedure well.

## 2015-12-13 ENCOUNTER — Encounter (HOSPITAL_COMMUNITY): Payer: Self-pay | Admitting: Orthopedic Surgery

## 2015-12-13 NOTE — Anesthesia Postprocedure Evaluation (Signed)
Anesthesia Post Note  Patient: Tracy Buckley  Procedure(s) Performed: Procedure(s) (LRB): RIGHT UNICOMPARTMENTAL KNEE (Right)  Patient location during evaluation: PACU Anesthesia Type: General and Regional Level of consciousness: awake and alert Pain management: satisfactory to patient Vital Signs Assessment: post-procedure vital signs reviewed and stable Respiratory status: spontaneous breathing, nonlabored ventilation, respiratory function stable and patient connected to nasal cannula oxygen Cardiovascular status: blood pressure returned to baseline and stable Postop Assessment: no signs of nausea or vomiting Anesthetic complications: no    Last Vitals:  Filed Vitals:   12/13/15 0838 12/13/15 0840  BP: 116/72 116/72  Pulse: 80   Temp:    Resp:      Last Pain:  Filed Vitals:   12/13/15 0845  PainSc: 10-Worst pain ever                 Cecile Hearing

## 2015-12-13 NOTE — Evaluation (Signed)
Occupational Therapy Evaluation AND Discharge  Patient Details Name: Tracy Buckley MRN: [id number] DOB: 08-May-1951 Today's Date: 12/13/2015    History of Present Illness Admitted for R Unicompartmental Knee;  has a past medical history of Nonischemic cardiomyopathy (HCC); Chronic chest pain; Essential hypertension, benign; Hypercholesteremia; Diverticulitis; Depression; Polysubstance abuse; Obesity; Peripheral vascular disease (HCC); Shortness of breath dyspnea (with exertion); Hypothyroidism; Arthritis; and Fibromyalgia.  has past surgical history that includes Back surgery; Tummy tuck (2009); Hand surgery; Hysterectomy - unknown type; Cesarean section; Shoulder surgery; Kidney stone surgery; left heart catheterization with coronary angiogram (N/A, 04/17/2012); and Abdominal hysterectomy.   Clinical Impression   Patient admitted with above. Patient independent PTA. Patient currently functioning at an overall supervision level using RW.  No additional OT needs identified, D/C from acute OT services and no additional follow-up OT needs identified at this time. All appropriate education provided to patient. Please re-order OT if needed.      Follow Up Recommendations  No OT follow up;Supervision - Intermittent    Equipment Recommendations  3 in 1 bedside comode    Recommendations for Other Services  None at this time    Precautions / Restrictions Precautions Precautions: Knee;Fall Precaution Comments: educated pt on knee precautions and no pillow under knee Restrictions Weight Bearing Restrictions: Yes RLE Weight Bearing: Weight bearing as tolerated    Mobility Bed Mobility Overal bed mobility:  (Pt in recliner chair on arrival.  ) General bed mobility comments: Pt found seated in recliner upon OT entering/exiting room   Transfers Overall transfer level: Needs assistance Equipment used: Rolling walker (2 wheeled) Transfers: Sit to/from Stand Sit to Stand: Supervision General  transfer comment: Supervision for safety, cues for hand placement and RLE management/placement     Balance Overall balance assessment: Needs assistance Sitting-balance support: No upper extremity supported;Feet supported Sitting balance-Leahy Scale: Good     Standing balance support: Bilateral upper extremity supported;During functional activity Standing balance-Leahy Scale: Fair Standing balance comment: heavy reliance on RW    ADL Overall ADL's : Needs assistance/impaired General ADL Comments: Pt overall supervision for ADLs and functional mobility. Pt mainly limtied by increased pain. Pt reports she plans to sponge bath for awhile instead of getting in/out of her tub/shower. no more OT needs identified at this time.     Pertinent Vitals/Pain Pain Assessment: 0-10 Pain Score: 10-Worst pain ever Pain Location: Right knee Pain Descriptors / Indicators: Aching;Sore;Tightness;Throbbing Pain Intervention(s): Limited activity within patient's tolerance;Monitored during session;Repositioned;Ice applied;Patient requesting pain meds-RN notified     Hand Dominance Right   Extremity/Trunk Assessment Upper Extremity Assessment Upper Extremity Assessment: Overall WFL for tasks assessed   Lower Extremity Assessment Lower Extremity Assessment: Defer to PT evaluation   Cervical / Trunk Assessment Cervical / Trunk Assessment: Normal   Communication Communication Communication: No difficulties   Cognition Arousal/Alertness: Awake/alert Behavior During Therapy: WFL for tasks assessed/performed;Impulsive Overall Cognitive Status: Within Functional Limits for tasks assessed              Home Living Family/patient expects to be discharged to:: Private residence Living Arrangements: Other (Comment) (granddaughter ) Available Help at Discharge: Family;Available PRN/intermittently Type of Home: Apartment Home Access: Stairs to enter Entrance Stairs-Number of Steps: 8 Entrance  Stairs-Rails: Right;Left;Can reach both Home Layout: One level     Bathroom Shower/Tub: IT trainer: Standard     Home Equipment: None    Prior Functioning/Environment Level of Independence: Independent     OT Diagnosis: Generalized weakness;Acute pain  OT Problem List:   N/a, no acute OT needs identified at this time     OT Treatment/Interventions:   N/a, no acute OT needs identified at this time     OT Goals(Current goals can be found in the care plan section) Acute Rehab OT Goals Patient Stated Goal: go home tomorrow  OT Goal Formulation: All assessment and education complete, DC therapy  OT Frequency:   N/a, no acute OT needs identified at this time     Barriers to D/C:  none known at this time     End of Session Equipment Utilized During Treatment: Rolling walker CPM Right Knee CPM Right Knee: Off Nurse Communication: Patient requests pain meds  Activity Tolerance: Patient limited by pain Patient left: in chair;with call bell/phone within reach   Time: 9480-1655 OT Time Calculation (min): 23 min Charges:  OT General Charges $OT Visit: 1 Procedure OT Evaluation $OT Eval Low Complexity: 1 Procedure OT Treatments $Self Care/Home Management : 8-22 mins   Edwin Cap , MS, OTR/L, CLT Pager: [phone]  12/13/2015, 2:36 PM

## 2015-12-13 NOTE — Progress Notes (Signed)
     Subjective:  POD#1 R unicompartmental knee arthroplasty. Patient reports pain as moderate.  Resting comfortably in bed this morning.  Tolerated the CPM for 1 hr this morning.  Has been out of bed to the bathroom several times overnight.  The patient has not mobilized with PT yet today.  Expect the patient will need one more night for pain control and to improve mobilization.    Objective:   VITALS:   Filed Vitals:   12/12/15 1222 12/12/15 1930 12/13/15 0000 12/13/15 0402  BP: 129/77 142/84 89/53 101/54  Pulse: 89 75 80 81  Temp: 97.7 F (36.5 C) 97.8 F (36.6 C) 98.1 F (36.7 C) 97.9 F (36.6 C)  TempSrc: Oral Oral  Oral  Resp: 16 16 16 16   Weight:      SpO2: 90% 100% 94% 97%    Neurologically intact ABD soft Neurovascular intact Sensation intact distally Intact pulses distally Dorsiflexion/Plantar flexion intact Incision: dressing C/D/I Dressing changed at the bedside today  Lab Results  Component Value Date   WBC 2.9* 12/01/2015   HGB 11.5* 12/01/2015   HCT 37.5 12/01/2015   MCV 96.9 12/01/2015   PLT 237 12/01/2015   BMET    Component Value Date/Time   NA 144 12/01/2015 1354   K 4.6 12/01/2015 1354   CL 104 12/01/2015 1354   CO2 30 12/01/2015 1354   GLUCOSE 103* 12/01/2015 1354   BUN 13 12/01/2015 1354   CREATININE 0.94 12/01/2015 1354   CALCIUM 9.7 12/01/2015 1354   GFRNONAA >60 12/01/2015 1354   GFRAA >60 12/01/2015 1354     Assessment/Plan: 1 Day Post-Op   Active Problems:   S/P total knee arthroplasty   Up with therapy WBAT in the RLE ASA 325mg  daily for DVT prophylaxis Anticipate discharge tomorrow  Lynann Bologna 12/13/2015, 8:14 AM Cell [phone]

## 2015-12-13 NOTE — Progress Notes (Signed)
Physical Therapy Treatment Patient Details Name: Tracy Buckley MRN: [id number] DOB: 1950-11-13 Today's Date: 12/13/2015    History of Present Illness Admitted for R Unicompartmental Knee;  has a past medical history of Nonischemic cardiomyopathy (HCC); Chronic chest pain; Essential hypertension, benign; Hypercholesteremia; Diverticulitis; Depression; Polysubstance abuse; Obesity; Peripheral vascular disease (HCC); Shortness of breath dyspnea (with exertion); Hypothyroidism; Arthritis; and Fibromyalgia.  has past surgical history that includes Back surgery; Tummy tuck (2009); Hand surgery; Hysterectomy - unknown type; Cesarean section; Shoulder surgery; Kidney stone surgery; left heart catheterization with coronary angiogram (N/A, 04/17/2012); and Abdominal hysterectomy.    PT Comments    Pt performed increased gait distance required cues for safety.  R Knee ROM 0-87 degrees.    Follow Up Recommendations  Home health PT     Equipment Recommendations  Rolling walker with 5" wheels;3in1 (PT)    Recommendations for Other Services       Precautions / Restrictions Precautions Precautions: Knee;Fall Precaution Comments: educated pt on knee precautions and no pillow under knee Restrictions Weight Bearing Restrictions: Yes RLE Weight Bearing: Weight bearing as tolerated    Mobility  Bed Mobility Overal bed mobility: Needs Assistance Bed Mobility: Sit to Supine     Supine to sit: Min assist (to lift LE back into bed.  )     General bed mobility comments: Pt required cues to negotiate B LEs into bed.    Transfers Overall transfer level: Needs assistance Equipment used: Rolling walker (2 wheeled) Transfers: Sit to/from Stand Sit to Stand: Min guard         General transfer comment: Required min guard secondary to pain.  Cues for hand and foot placement.    Ambulation/Gait Ambulation/Gait assistance: Min assist Ambulation Distance (Feet): 120 Feet Assistive device: Rolling  walker (2 wheeled) Gait Pattern/deviations: Step-to pattern;Decreased stride length;Decreased weight shift to right;Decreased weight shift to left;Decreased stance time - right;Decreased step length - left;Shuffle;Antalgic     General Gait Details: Pt required cues for sequencing, R heel strike/knee extension and RW safety.     Stairs            Wheelchair Mobility    Modified Rankin (Stroke Patients Only)       Balance Overall balance assessment: Needs assistance Sitting-balance support: No upper extremity supported;Feet supported Sitting balance-Leahy Scale: Good     Standing balance support: Bilateral upper extremity supported;During functional activity Standing balance-Leahy Scale: Fair Standing balance comment: heavy reliance on RW                    Cognition Arousal/Alertness: Awake/alert Behavior During Therapy: WFL for tasks assessed/performed;Impulsive Overall Cognitive Status: Within Functional Limits for tasks assessed                      Exercises Total Joint Exercises Ankle Circles/Pumps: AROM;Both;20 reps;Seated;Supine Quad Sets: AROM;Seated;10 reps;Right;Supine Gluteal Sets: AROM;10 reps;Both;Supine Heel Slides: Right;10 reps;AAROM;Seated;Supine Hip ABduction/ADduction: AAROM;Right;10 reps;Supine Straight Leg Raises: AAROM;Right;10 reps;Supine Long Arc Quad: AAROM;Right;10 reps;Supine    General Comments        Pertinent Vitals/Pain Pain Assessment: 0-10 Pain Score: 8  Pain Location: R knee Pain Descriptors / Indicators: Aching;Tightness Pain Intervention(s): Repositioned;Ice applied    Home Living Family/patient expects to be discharged to:: Private residence Living Arrangements: Other (Comment) (granddaughter ) Available Help at Discharge: Family;Available PRN/intermittently Type of Home: Apartment Home Access: Stairs to enter Entrance Stairs-Rails: Right;Left;Can reach both Home Layout: One level Home Equipment:  None  Prior Function Level of Independence: Independent          PT Goals (current goals can now be found in the care plan section) Acute Rehab PT Goals Patient Stated Goal: go home tomorrow  PT Goal Formulation: Patient unable to participate in goal setting Potential to Achieve Goals: Good Progress towards PT goals: Progressing toward goals    Frequency  7X/week    PT Plan      Co-evaluation             End of Session Equipment Utilized During Treatment: Gait belt Activity Tolerance: Patient limited by pain Patient left: in chair;with call bell/phone within reach;with nursing/sitter in room     Time: 1610-9604 PT Time Calculation (min) (ACUTE ONLY): 28 min  Charges:  $Gait Training: 8-22 mins $Therapeutic Activity: 8-22 mins                    G Codes:      Florestine Avers 12/30/15, 4:55 PM Joycelyn Rua, PTA pager [phone]

## 2015-12-13 NOTE — Progress Notes (Signed)
Physical Therapy Treatment Patient Details Name: Tracy Buckley MRN: [id number] DOB: Jan 11, 1951 Today's Date: 12/13/2015    History of Present Illness Admitted for R Unicompartmental Knee;  has a past medical history of Nonischemic cardiomyopathy (HCC); Chronic chest pain; Essential hypertension, benign; Hypercholesteremia; Diverticulitis; Depression; Polysubstance abuse; Obesity; Peripheral vascular disease (HCC); Shortness of breath dyspnea (with exertion); Hypothyroidism; Arthritis; and Fibromyalgia.  has past surgical history that includes Back surgery; Tummy tuck (2009); Hand surgery; Hysterectomy - unknown type; Cesarean section; Shoulder surgery; Kidney stone surgery; left heart catheterization with coronary angiogram (N/A, 04/17/2012); and Abdominal hysterectomy.    PT Comments    Pt performed increased gait distance 32 ft with min assist.  May knee Knee immobilizer for d/c home if buckling does not improve.  Pt performed reclined therapeutic exercise 1x10 reps to improve strength and ROM in R knee.  CP applied post tx to help with pain.  Pt placed in 0 degree bone foam.    Follow Up Recommendations  Home health PT     Equipment Recommendations  Rolling walker with 5" wheels;3in1 (PT)    Recommendations for Other Services       Precautions / Restrictions Precautions Precautions: Knee;Fall Restrictions Weight Bearing Restrictions: Yes RLE Weight Bearing: Weight bearing as tolerated    Mobility  Bed Mobility Overal bed mobility:  (Pt in recliner chair on arrival.  )                Transfers Overall transfer level: Needs assistance Equipment used: Rolling walker (2 wheeled) Transfers: Sit to/from Stand Sit to Stand: Min guard         General transfer comment: Cues for hand placement as pt initially pulls on RW for support.  Pt required cues for foot placement to ease pain.    Ambulation/Gait Ambulation/Gait assistance: Min assist Ambulation Distance (Feet): 32  Feet Assistive device: Rolling walker (2 wheeled) Gait Pattern/deviations: Step-to pattern;Decreased stance time - right;Decreased weight shift to right;Shuffle     General Gait Details: Poor foot clearance on LLE.  Pt demonstrated knee instability on R producing buckling requiring R knee block to improve quad control in R stance phase.  Pt tolerated advanced gait distance well.     Stairs            Wheelchair Mobility    Modified Rankin (Stroke Patients Only)       Balance                                    Cognition Arousal/Alertness: Awake/alert Behavior During Therapy: WFL for tasks assessed/performed;Impulsive Overall Cognitive Status: Within Functional Limits for tasks assessed                      Exercises Total Joint Exercises Ankle Circles/Pumps: AROM;Both;20 reps;Seated Quad Sets: AROM;Seated;10 reps;Right (reclined) Gluteal Sets: AROM;10 reps;Both (reclined) Heel Slides: Right;10 reps;AAROM;Seated (reclined) Hip ABduction/ADduction: AAROM;Right;10 reps (reclined) Straight Leg Raises: AAROM;Right;10 reps (reclined) Long Arc Quad: AAROM;Right;10 reps    General Comments General comments (skin integrity, edema, etc.): not formally assessed.        Pertinent Vitals/Pain Pain Assessment: 0-10 Pain Score: 9  Pain Location: R knee Pain Descriptors / Indicators: Aching;Tightness    Home Living                      Prior Function  PT Goals (current goals can now be found in the care plan section) Acute Rehab PT Goals Patient Stated Goal: did not state Potential to Achieve Goals: Good    Frequency  7X/week    PT Plan      Co-evaluation             End of Session Equipment Utilized During Treatment: Gait belt Activity Tolerance: Patient limited by pain Patient left: in chair;with call bell/phone within reach;with nursing/sitter in room     Time: 1147-1210 PT Time Calculation (min) (ACUTE  ONLY): 23 min  Charges:  $Gait Training: 8-22 mins $Therapeutic Exercise: 8-22 mins                    G Codes:      Florestine Avers 2015/12/19, 12:23 PM  Joycelyn Rua, PTA pager [phone]

## 2015-12-14 ENCOUNTER — Ambulatory Visit: Payer: Medicaid Other | Admitting: Cardiology

## 2015-12-14 DIAGNOSIS — M1711 Unilateral primary osteoarthritis, right knee: Secondary | ICD-10-CM | POA: Diagnosis present

## 2015-12-14 NOTE — Progress Notes (Signed)
Tracy Buckley discharged home per MD order. Discharge instructions reviewed and discussed with patient. All questions and concerns answered. Copy of instructions and scripts given to patient. IV removed.  Patient escorted to car by staff in a wheelchair. No distress noted upon discharge.   Rosita Fire 12/14/2015 12:33 PM

## 2015-12-14 NOTE — Progress Notes (Signed)
Orthopedic Tech Progress Note Patient Details:  ROSELYNNE DEVOSS 1951/10/02 [id number]  Patient ID: [name], female   DOB: 1950-11-14, 65 y.o.   MRN: [id number] Pt. Refused cpm. Will call when ready  Trinna Post 12/14/2015, 5:20 AM

## 2015-12-14 NOTE — Discharge Instructions (Signed)
INSTRUCTIONS AFTER JOINT REPLACEMENT   o Remove items at home which could result in a fall. This includes throw rugs or furniture in walking pathways o ICE to the affected joint every three hours while awake for 30 minutes at a time, for at least the first 3-5 days, and then as needed for pain and swelling.  Continue to use ice for pain and swelling. You may notice swelling that will progress down to the foot and ankle.  This is normal after surgery.  Elevate your leg when you are not up walking on it.   o Continue to use the breathing machine you got in the hospital (incentive spirometer) which will help keep your temperature down.  It is common for your temperature to cycle up and down following surgery, especially at night when you are not up moving around and exerting yourself.  The breathing machine keeps your lungs expanded and your temperature down.   DIET:  As you were doing prior to hospitalization, we recommend a well-balanced diet.  DRESSING / WOUND CARE / SHOWERING  Keep the surgical dressing until follow up.  IF THE DRESSING FALLS OFF or the wound gets wet inside, change the dressing with sterile gauze.  Please use good hand washing techniques before changing the dressing.  Do not use any lotions or creams on the incision until instructed by your surgeon.    ACTIVITY  o Increase activity slowly as tolerated, but follow the weight bearing instructions below.   o No driving for 6 weeks or until further direction given by your physician.  You cannot drive while taking narcotics.  o No lifting or carrying greater than 10 lbs. until further directed by your surgeon. o Avoid periods of inactivity such as sitting longer than an hour when not asleep. This helps prevent blood clots.  o You may return to work once you are authorized by your doctor.     WEIGHT BEARING   Weight bearing as tolerated with assist device (walker, cane, etc) as directed, use it as long as suggested by your  surgeon or therapist, typically at least 4-6 weeks.  Bledso knee brace is to be worn only when ambulating.  May come out of the knee brace to work ROM and quad strengthening.    EXERCISES  Results after joint replacement surgery are often greatly improved when you follow the exercise, range of motion and muscle strengthening exercises prescribed by your doctor. Safety measures are also important to protect the joint from further injury. Any time any of these exercises cause you to have increased pain or swelling, decrease what you are doing until you are comfortable again and then slowly increase them. If you have problems or questions, call your caregiver or physical therapist for advice.   Rehabilitation is important following a joint replacement. After just a few days of immobilization, the muscles of the leg can become weakened and shrink (atrophy).  These exercises are designed to build up the tone and strength of the thigh and leg muscles and to improve motion. Often times heat used for twenty to thirty minutes before working out will loosen up your tissues and help with improving the range of motion but do not use heat for the first two weeks following surgery (sometimes heat can increase post-operative swelling).   These exercises can be done on a training (exercise) mat, on the floor, on a table or on a bed. Use whatever works the best and is most comfortable for you.  Use music or television while you are exercising so that the exercises are a pleasant break in your day. This will make your life better with the exercises acting as a break in your routine that you can look forward to.   Perform all exercises about fifteen times, three times per day or as directed.  You should exercise both the operative leg and the other leg as well.  Exercises include:    Quad Sets - Tighten up the muscle on the front of the thigh (Quad) and hold for 5-10 seconds.    Straight Leg Raises - With your knee  straight (if you were given a brace, keep it on), lift the leg to 60 degrees, hold for 3 seconds, and slowly lower the leg.  Perform this exercise against resistance later as your leg gets stronger.   Leg Slides: Lying on your back, slowly slide your foot toward your buttocks, bending your knee up off the floor (only go as far as is comfortable). Then slowly slide your foot back down until your leg is flat on the floor again.   Angel Wings: Lying on your back spread your legs to the side as far apart as you can without causing discomfort.   Hamstring Strength:  Lying on your back, push your heel against the floor with your leg straight by tightening up the muscles of your buttocks.  Repeat, but this time bend your knee to a comfortable angle, and push your heel against the floor.  You may put a pillow under the heel to make it more comfortable if necessary.   A rehabilitation program following joint replacement surgery can speed recovery and prevent re-injury in the future due to weakened muscles. Contact your doctor or a physical therapist for more information on knee rehabilitation.    CONSTIPATION  Constipation is defined medically as fewer than three stools per week and severe constipation as less than one stool per week.  Even if you have a regular bowel pattern at home, your normal regimen is likely to be disrupted due to multiple reasons following surgery.  Combination of anesthesia, postoperative narcotics, change in appetite and fluid intake all can affect your bowels.   YOU MUST use at least one of the following options; they are listed in order of increasing strength to get the job done.  They are all available over the counter, and you may need to use some, POSSIBLY even all of these options:    Drink plenty of fluids (prune juice may be helpful) and high fiber foods Colace 100 mg by mouth twice a day  Senokot for constipation as directed and as needed Dulcolax (bisacodyl), take with  full glass of water  Miralax (polyethylene glycol) once or twice a day as needed.  If you have tried all these things and are unable to have a bowel movement in the first 3-4 days after surgery call either your surgeon or your primary doctor.    If you experience loose stools or diarrhea, hold the medications until you stool forms back up.  If your symptoms do not get better within 1 week or if they get worse, check with your doctor.  If you experience "the worst abdominal pain ever" or develop nausea or vomiting, please contact the office immediately for further recommendations for treatment.   ITCHING:  If you experience itching with your medications, try taking only a single pain pill, or even half a pain pill at a time.  You can also  use Benadryl over the counter for itching or also to help with sleep.   TED HOSE STOCKINGS:  Use stockings on both legs until for at least 2 weeks or as directed by physician office. They may be removed at night for sleeping.  MEDICATIONS:  See your medication summary on the After Visit Summary that nursing will review with you.  You may have some home medications which will be placed on hold until you complete the course of blood thinner medication.  It is important for you to complete the blood thinner medication as prescribed.  PRECAUTIONS:  If you experience chest pain or shortness of breath - call 911 immediately for transfer to the hospital emergency department.   If you develop a fever greater that 101 F, purulent drainage from wound, increased redness or drainage from wound, foul odor from the wound/dressing, or calf pain - CONTACT YOUR SURGEON.                                                   FOLLOW-UP APPOINTMENTS:  If you do not already have a post-op appointment, please call the office for an appointment to be seen by your surgeon.  Guidelines for how soon to be seen are listed in your After Visit Summary, but are typically between 1-4 weeks after  surgery.  OTHER INSTRUCTIONS:   Knee Replacement:  Do not place pillow under knee, focus on keeping the knee straight while resting. CPM instructions: 0-90 degrees, 2 hours in the morning, 2 hours in the afternoon, and 2 hours in the evening. Place foam block, curve side up under heel at all times except when in CPM or when walking.  DO NOT modify, tear, cut, or change the foam block in any way.  MAKE SURE YOU:   Understand these instructions.   Get help right away if you are not doing well or get worse.    Thank you for letting us be a part of your medical care team.  It is a privilege we respect greatly.  We hope these instructions will help you stay on track for a fast and full recovery!

## 2015-12-14 NOTE — Progress Notes (Signed)
     Subjective:  POD#2 R Unicompartmental knee arthroplasty.  Patient reports pain as mild to moderate.  Resting comfortably in bed this morning.  Patient had improved mobility with PT yesterday afternoon.  Was able to ambulate 129ft.  PT will work on stairs this morning.  Anticipate that the patient will be appropriate for discharge to home today.  I have also ordered a bledso knee brace to be applied to the knee and opened from 0-30 degrees.  This should help to give the knee some extra support will PT works on improving the patient's quad strength.    Objective:   VITALS:   Filed Vitals:   12/13/15 0840 12/13/15 1304 12/13/15 1617 12/13/15 1945  BP: 116/72 103/61 127/70 132/77  Pulse:  74 88 78  Temp:  99.1 F (37.3 C)  98.9 F (37.2 C)  TempSrc:  Oral  Oral  Resp:  18  17  Weight:      SpO2:  98%  96%    Neurologically intact ABD soft Neurovascular intact Sensation intact distally Intact pulses distally Dorsiflexion/Plantar flexion intact Incision: dressing C/D/I   Lab Results  Component Value Date   WBC 2.9* 12/01/2015   HGB 11.5* 12/01/2015   HCT 37.5 12/01/2015   MCV 96.9 12/01/2015   PLT 237 12/01/2015   BMET    Component Value Date/Time   NA 144 12/01/2015 1354   K 4.6 12/01/2015 1354   CL 104 12/01/2015 1354   CO2 30 12/01/2015 1354   GLUCOSE 103* 12/01/2015 1354   BUN 13 12/01/2015 1354   CREATININE 0.94 12/01/2015 1354   CALCIUM 9.7 12/01/2015 1354   GFRNONAA >60 12/01/2015 1354   GFRAA >60 12/01/2015 1354     Assessment/Plan: 2 Days Post-Op   Active Problems:   S/P total knee arthroplasty   Up with therapy WBAT in the RLE Bledso knee brace for ambulation only.  To come out of the brace and work quad strength and ROM. ASA 325mg  daily for DVT prophylaxis Will discharge to home today   Tracy Buckley Tracy Buckley 12/14/2015, 9:59 AM Cell [phone]

## 2015-12-14 NOTE — Progress Notes (Signed)
Physical Therapy Treatment Patient Details Name: Tracy Buckley MRN: [id number] DOB: Oct 16, 1950 Today's Date: 12/14/2015    History of Present Illness Admitted for R Unicompartmental Knee;  has a past medical history of Nonischemic cardiomyopathy (HCC); Chronic chest pain; Essential hypertension, benign; Hypercholesteremia; Diverticulitis; Depression; Polysubstance abuse; Obesity; Peripheral vascular disease (HCC); Shortness of breath dyspnea (with exertion); Hypothyroidism; Arthritis; and Fibromyalgia.  has past surgical history that includes Back surgery; Tummy tuck (2009); Hand surgery; Hysterectomy - unknown type; Cesarean section; Shoulder surgery; Kidney stone surgery; left heart catheterization with coronary angiogram (N/A, 04/17/2012); and Abdominal hysterectomy.    PT Comments    Patient continues to progress toward mobility goals with no knee buckling noted this session. Stair training complete with pt able to ascend/descend 12 steps with min guard assist. Continue to progress as tolerated with anticipated d/c home with HHPT.   Follow Up Recommendations  Home health PT     Equipment Recommendations  Rolling walker with 5" wheels;3in1 (PT)    Recommendations for Other Services OT consult     Precautions / Restrictions Precautions Precautions: Knee;Fall Precaution Comments: educated pt on knee precautions and no pillow under knee Restrictions Weight Bearing Restrictions: Yes RLE Weight Bearing: Weight bearing as tolerated    Mobility  Bed Mobility Overal bed mobility: Needs Assistance Bed Mobility: Supine to Sit     Supine to sit: Min guard     General bed mobility comments: min guard for safety; no physical assist needed; cues for technique  Transfers Overall transfer level: Needs assistance Equipment used: Rolling walker (2 wheeled) Transfers: Sit to/from Stand Sit to Stand: Min guard         General transfer comment: cues for hand placement; min guard for  safety; no unsteadiness upon standing  Ambulation/Gait Ambulation/Gait assistance: Min guard;Supervision Ambulation Distance (Feet): 150 Feet Assistive device: Rolling walker (2 wheeled) Gait Pattern/deviations: Step-to pattern;Step-through pattern;Decreased stance time - right;Decreased stride length     General Gait Details: cues for position of RW, sequencing, and righting eyes; Pt with improved ability to weight bear throuhg R LE and with improved R heel strike; no knee buckling noted this session   Stairs Stairs: Yes Stairs assistance: Min guard Stair Management: No rails;Backwards;With walker Number of Stairs: 12 (8 and 4) General stair comments: educated pt on technique and sequencing; pt very motivated and with good safety awareness; practiced 8 steps and then practiced 4 steps without cues with pt demonstrating proper technique; no knee buckling or unsteadiness  Wheelchair Mobility    Modified Rankin (Stroke Patients Only)       Balance Overall balance assessment: Needs assistance Sitting-balance support: No upper extremity supported;Feet supported Sitting balance-Leahy Scale: Good     Standing balance support: No upper extremity supported Standing balance-Leahy Scale: Fair Standing balance comment: static standing without UE support                    Cognition Arousal/Alertness: Awake/alert Behavior During Therapy: WFL for tasks assessed/performed;Impulsive Overall Cognitive Status: Within Functional Limits for tasks assessed                      Exercises Total Joint Exercises Quad Sets: AROM;Both;15 reps;Seated Heel Slides: AROM;Right;10 reps;Seated Goniometric ROM: 0-85    General Comments General comments (skin integrity, edema, etc.): educated pt on HEP, given HEP handout, use of ice packs, zero degree foam, and CPM machine      Pertinent Vitals/Pain Pain Assessment: 0-10 Pain Score: 7  Pain Location: R knee with mobility Pain  Descriptors / Indicators: Aching;Sore Pain Intervention(s): Limited activity within patient's tolerance;Monitored during session;Premedicated before session;Repositioned;Ice applied    Home Living                      Prior Function            PT Goals (current goals can now be found in the care plan section) Acute Rehab PT Goals Patient Stated Goal: be independent again Progress towards PT goals: Progressing toward goals    Frequency  7X/week    PT Plan Current plan remains appropriate    Co-evaluation             End of Session Equipment Utilized During Treatment: Gait belt Activity Tolerance: Patient tolerated treatment well Patient left: in chair;with call bell/phone within reach;Other (comment) (zero degree foam and ice pack applied)     Time: 6712-4580 PT Time Calculation (min) (ACUTE ONLY): 41 min  Charges:  $Gait Training: 23-37 mins $Therapeutic Exercise: 8-22 mins                    G Codes:      Derek Mound, PTA Pager: [phone]   12/14/2015, 11:03 AM

## 2015-12-14 NOTE — Progress Notes (Signed)
Orthopedic Tech Progress Note Patient Details:  Tracy Buckley 1951/02/09 [id number]  Patient ID: Tracy Buckley, female   DOB: 1950-11-05, 65 y.o.   MRN: [id number]   Tracy Buckley 12/14/2015, 9:21 AMCalled [employer] for Bledsoe brace.

## 2015-12-14 NOTE — Discharge Summary (Signed)
Physician Discharge Summary  Patient ID: Tracy Buckley MRN: [id number] DOB/AGE: [birth year] 65 y.o.  Admit date: 12/12/2015 Discharge date: 12/14/2015  Admission Diagnoses:  Primary osteoarthritis of right knee  Discharge Diagnoses:  Principal Problem:   Primary osteoarthritis of right knee Active Problems:   S/P total knee arthroplasty   Past Medical History  Diagnosis Date  . Nonischemic cardiomyopathy (HCC)      No obstructive disease at catheterization 2002 - Dr. [name] and in July of 2013. EF is 50 to 55%. Grade 1 diastolic dysfunction noted on echo 04/2012  . Chronic chest pain   . Essential hypertension, benign   . Hypercholesteremia   . Diverticulitis   . Depression     Admission for suicidal ideation 2001  . Polysubstance abuse      Remote history  . Obesity   . Peripheral vascular disease (HCC)   . Shortness of breath dyspnea with exertion  . Hypothyroidism   . Arthritis   . Fibromyalgia     Surgeries: Procedure(s): RIGHT UNICOMPARTMENTAL KNEE on 12/12/2015   Consultants (if any):    Discharged Condition: Improved  Hospital Course: Tracy Buckley is an 65 y.o. female who was admitted 12/12/2015 with a diagnosis of Primary osteoarthritis of right knee and went to the operating room on 12/12/2015 and underwent the above named procedures.    She was given perioperative antibiotics:      Anti-infectives    Start     Dose/Rate Route Frequency Ordered Stop   12/12/15 1400  ceFAZolin (ANCEF) IVPB 2 g/50 mL premix     2 g 100 mL/hr over 30 Minutes Intravenous Every 6 hours 12/12/15 1126 12/12/15 2103   12/12/15 0700  ceFAZolin (ANCEF) IVPB 2 g/50 mL premix     2 g 100 mL/hr over 30 Minutes Intravenous To ShortStay Surgical 12/11/15 1133 12/12/15 0750    .  She was given sequential compression devices, early ambulation, and ASA 325mg  for DVT prophylaxis.  She benefited maximally from the hospital stay and there were no complications.    Recent vital  signs:  Filed Vitals:   12/13/15 1617 12/13/15 1945  BP: 127/70 132/77  Pulse: 88 78  Temp:  98.9 F (37.2 C)  Resp:  17    Recent laboratory studies:  Lab Results  Component Value Date   HGB 11.5* 12/01/2015   HGB 11.6* 01/16/2013   HGB 11.2* 04/16/2012   Lab Results  Component Value Date   WBC 2.9* 12/01/2015   PLT 237 12/01/2015   Lab Results  Component Value Date   INR 1.06 12/01/2015   Lab Results  Component Value Date   NA 144 12/01/2015   K 4.6 12/01/2015   CL 104 12/01/2015   CO2 30 12/01/2015   BUN 13 12/01/2015   CREATININE 0.94 12/01/2015   GLUCOSE 103* 12/01/2015    Discharge Medications:     Medication List    TAKE these medications        aspirin 325 MG tablet  Take 1 tablet (325 mg total) by mouth daily.     carvedilol 3.125 MG tablet  Commonly known as:  COREG  Take 3.125 mg by mouth 2 (two) times daily with a meal.     diazepam 5 MG tablet  Commonly known as:  VALIUM  Take 1 tablet by mouth daily. Take 1 tab daily     docusate sodium 100 MG capsule  Commonly known as:  COLACE  Take 1 capsule (100 mg total)  by mouth 2 (two) times daily.     HYDROcodone-acetaminophen 5-325 MG tablet  Commonly known as:  NORCO/VICODIN  Take 2 tablets by mouth every 4 (four) hours as needed for pain.     levothyroxine 75 MCG tablet  Commonly known as:  SYNTHROID, LEVOTHROID  Take 1 tablet by mouth daily.     morphine 30 MG 12 hr tablet  Commonly known as:  MS CONTIN  Take 30 mg by mouth 2 (two) times daily.     nitroGLYCERIN 0.4 MG SL tablet  Commonly known as:  NITROSTAT  Place 1 tablet (0.4 mg total) under the tongue every 5 (five) minutes as needed.     ondansetron 4 MG tablet  Commonly known as:  ZOFRAN  Take 1 tablet (4 mg total) by mouth every 8 (eight) hours as needed for nausea or vomiting.     potassium chloride SA 20 MEQ tablet  Commonly known as:  K-DUR,KLOR-CON  Take 20 mEq by mouth every morning.     pregabalin 75 MG capsule   Commonly known as:  LYRICA  Take 75 mg by mouth at bedtime.     ramipril 10 MG capsule  Commonly known as:  ALTACE  Take 10 mg by mouth daily.     simvastatin 20 MG tablet  Commonly known as:  ZOCOR  Take 20 mg by mouth every evening.     tiZANidine 4 MG tablet  Commonly known as:  ZANAFLEX  Take 4 mg by mouth at bedtime as needed (muscle spasm).     traZODone 50 MG tablet  Commonly known as:  DESYREL  Take 1 tablet by mouth at bedtime as needed for sleep. Take 1 tab daily as needed for sleep        Diagnostic Studies: Dg Knee Right Port  12/12/2015  CLINICAL DATA:  Unicompartmental knee prosthesis EXAM: PORTABLE RIGHT KNEE - 1-2 VIEW COMPARISON:  None. FINDINGS: Medial joint replacement is noted. No acute bony abnormality is seen. Mild degenerative changes in the lateral and patellofemoral spaces are noted. IMPRESSION: Status post partial knee replacement Electronically Signed   By: Alcide Clever M.D.   On: 12/12/2015 10:18    Disposition: 07-Left Against Medical Advice  Discharge Instructions    Weight bearing as tolerated    Complete by:  As directed   Laterality:  right  Extremity:  Lower           Follow-up Information    Follow up with MURPHY, TIMOTHY D, MD In 10 days.   Specialty:  Orthopedic Surgery   Contact information:   [address] [place] [address] [phone]       Follow up with [facility].   Why:  Someone from [facility] will contact you concerning start date and time for therapy.   Contact information:   [address] [place] [address] [phone]        Signed: Lynann Bologna 12/14/2015, 10:04 AM Cell [phone]

## 2016-01-15 ENCOUNTER — Ambulatory Visit (HOSPITAL_COMMUNITY)
Admission: RE | Admit: 2016-01-15 | Discharge: 2016-01-15 | Disposition: A | Discharge status: Home / Self Care | Payer: Medicare Other | Source: Ambulatory Visit | Attending: Cardiology | Admitting: Cardiology

## 2016-01-15 ENCOUNTER — Other Ambulatory Visit (HOSPITAL_COMMUNITY): Payer: Self-pay | Admitting: Orthopedic Surgery

## 2016-01-15 DIAGNOSIS — M79604 Pain in right leg: Secondary | ICD-10-CM | POA: Diagnosis present

## 2016-01-15 DIAGNOSIS — M7989 Other specified soft tissue disorders: Secondary | ICD-10-CM

## 2016-01-15 DIAGNOSIS — I10 Essential (primary) hypertension: Secondary | ICD-10-CM | POA: Insufficient documentation

## 2016-01-15 DIAGNOSIS — E7800 Pure hypercholesterolemia, unspecified: Secondary | ICD-10-CM | POA: Diagnosis not present

## 2017-01-17 ENCOUNTER — Emergency Department (HOSPITAL_COMMUNITY): Payer: Medicare Other

## 2017-01-17 ENCOUNTER — Encounter (HOSPITAL_COMMUNITY): Payer: Self-pay | Admitting: *Deleted

## 2017-01-17 ENCOUNTER — Emergency Department (HOSPITAL_COMMUNITY)
Admission: EM | Admit: 2017-01-17 | Discharge: 2017-01-17 | Disposition: A | Discharge status: Home / Self Care | Payer: Medicare Other | Attending: Emergency Medicine | Admitting: Emergency Medicine

## 2017-01-17 DIAGNOSIS — Z7982 Long term (current) use of aspirin: Secondary | ICD-10-CM | POA: Diagnosis not present

## 2017-01-17 DIAGNOSIS — R072 Precordial pain: Secondary | ICD-10-CM | POA: Insufficient documentation

## 2017-01-17 DIAGNOSIS — E039 Hypothyroidism, unspecified: Secondary | ICD-10-CM | POA: Diagnosis not present

## 2017-01-17 DIAGNOSIS — Z96651 Presence of right artificial knee joint: Secondary | ICD-10-CM | POA: Insufficient documentation

## 2017-01-17 DIAGNOSIS — Z87891 Personal history of nicotine dependence: Secondary | ICD-10-CM | POA: Diagnosis not present

## 2017-01-17 DIAGNOSIS — M546 Pain in thoracic spine: Secondary | ICD-10-CM | POA: Diagnosis not present

## 2017-01-17 DIAGNOSIS — I10 Essential (primary) hypertension: Secondary | ICD-10-CM | POA: Diagnosis not present

## 2017-01-17 DIAGNOSIS — M549 Dorsalgia, unspecified: Secondary | ICD-10-CM

## 2017-01-17 DIAGNOSIS — R079 Chest pain, unspecified: Secondary | ICD-10-CM | POA: Diagnosis present

## 2017-01-17 LAB — BASIC METABOLIC PANEL
Anion gap: 6 (ref 5–15)
BUN: 11 mg/dL (ref 6–20)
CALCIUM: 9 mg/dL (ref 8.9–10.3)
CHLORIDE: 104 mmol/L (ref 101–111)
CO2: 32 mmol/L (ref 22–32)
CREATININE: 0.81 mg/dL (ref 0.44–1.00)
GFR calc non Af Amer: >60 mL/min (ref >60)
Glucose, Bld: 100 mg/dL — ABNORMAL HIGH (ref 65–99)
Potassium: 3.5 mmol/L (ref 3.5–5.1)
SODIUM: 142 mmol/L (ref 135–145)

## 2017-01-17 LAB — CBC
HCT: 34.6 % — ABNORMAL LOW (ref 36.0–46.0)
Hemoglobin: 11.1 g/dL — ABNORMAL LOW (ref 12.0–15.0)
MCH: 31.2 pg (ref 26.0–34.0)
MCHC: 32.1 g/dL (ref 30.0–36.0)
MCV: 97.2 fL (ref 78.0–100.0)
PLATELETS: 209 10*3/uL (ref 150–400)
RBC: 3.56 MIL/uL — AB (ref 3.87–5.11)
RDW: 12.3 % (ref 11.5–15.5)
WBC: 4.1 10*3/uL (ref 4.0–10.5)

## 2017-01-17 LAB — I-STAT TROPONIN, ED: TROPONIN I, POC: 0 ng/mL (ref 0.00–0.08)

## 2017-01-17 MED ORDER — TRAMADOL HCL 50 MG PO TABS: 50.0000 mg | ORAL_TABLET | Freq: Four times a day (QID) | ORAL | 0 refills | Status: DC | PRN

## 2017-01-17 MED ORDER — HYDROCODONE-ACETAMINOPHEN 5-325 MG PO TABS
1.0000 | ORAL_TABLET | Freq: Once | ORAL | Status: AC
  Administered 2017-01-17: 1 via ORAL
  Filled 2017-01-17: qty 1

## 2017-01-17 MED ORDER — LORAZEPAM 0.5 MG PO TABS
0.5000 mg | ORAL_TABLET | Freq: Once | ORAL | Status: AC
  Administered 2017-01-17: 0.5 mg via ORAL
  Filled 2017-01-17: qty 1

## 2017-01-17 MED ORDER — ALUM & MAG HYDROXIDE-SIMETH 200-200-20 MG/5ML PO SUSP
30.0000 mL | Freq: Once | ORAL | Status: AC
  Administered 2017-01-17: 30 mL via ORAL
  Filled 2017-01-17: qty 30

## 2017-01-17 MED ORDER — FAMOTIDINE 20 MG PO TABS
20.0000 mg | ORAL_TABLET | Freq: Once | ORAL | Status: AC
  Administered 2017-01-17: 20 mg via ORAL
  Filled 2017-01-17: qty 1

## 2017-01-17 MED ORDER — IBUPROFEN 200 MG PO TABS
400.0000 mg | ORAL_TABLET | Freq: Once | ORAL | Status: AC
  Administered 2017-01-17: 400 mg via ORAL
  Filled 2017-01-17: qty 2

## 2017-01-17 NOTE — ED Provider Notes (Signed)
[facility]-EMERGENCY DEPT Provider Note   CSN: [id number] Arrival date & time: 01/17/17  1507     History   Chief Complaint Chief Complaint  Patient presents with  . Chest Pain  . Back Pain    HPI Tracy Buckley is a 66 y.o. female.  Patient c/o pain to back and mid chest for the past 2 weeks. Pain constant, dull, better when calm, worse if upset. Denies relation to activity or exertion, but certain positional changes seem to make worse. No associated sob, nv or diaphoresis. Pain is not pleuritic. Remote hx cholecystectomy. Denies heartburn. No pleuritic pain. No leg pain or swelling. No hx dvt or pe. Has noted increase in non prod cough. No sore throat. No fever or chills. Denies unusual doe or fatigue.    The history is provided by the patient.  Chest Pain   Associated symptoms include back pain. Pertinent negatives include no abdominal pain, no fever, no headaches, no shortness of breath and no vomiting.  Back Pain   Associated symptoms include chest pain. Pertinent negatives include no fever, no headaches and no abdominal pain.    Past Medical History:  Diagnosis Date  . Arthritis   . Chronic chest pain   . Depression    Admission for suicidal ideation 2001  . Diverticulitis   . Essential hypertension, benign   . Fibromyalgia   . Hypercholesteremia   . Hypothyroidism   . Nonischemic cardiomyopathy (HCC)     No obstructive disease at catheterization 2002 - Dr. [name] and in July of 2013. EF is 50 to 55%. Grade 1 diastolic dysfunction noted on echo 04/2012  . Obesity   . Peripheral vascular disease (HCC)   . Polysubstance abuse     Remote history  . Shortness of breath dyspnea with exertion    Patient Active Problem List   Diagnosis Date Noted  . Primary osteoarthritis of right knee 12/14/2015  . S/P total knee arthroplasty 12/12/2015  . Precordial pain 04/16/2012  . Nonischemic cardiomyopathy (HCC) 04/16/2012  . Essential hypertension, benign 04/16/2012  . Mixed  hyperlipidemia 04/16/2012    Past Surgical History:  Procedure Laterality Date  . ABDOMINAL HYSTERECTOMY    . BACK SURGERY     x2  . CESAREAN SECTION    . HAND SURGERY     x2  . Hysterectomy - unknown type    . KIDNEY STONE SURGERY    . LEFT HEART CATHETERIZATION WITH CORONARY ANGIOGRAM N/A 04/17/2012   Procedure: LEFT HEART CATHETERIZATION WITH CORONARY ANGIOGRAM;  Surgeon: Kathleene Hazel, MD;  Location: [facility] CATH LAB;  Service: Cardiovascular;  Laterality: N/A;  . PARTIAL KNEE ARTHROPLASTY Right 12/12/2015   Procedure: RIGHT UNICOMPARTMENTAL KNEE;  Surgeon: Sheral Apley, MD;  Location: [facility] OR;  Service: Orthopedics;  Laterality: Right;  . SHOULDER SURGERY    . Tummy tuck  2009    OB History    No data available       Home Medications    Prior to Admission medications   Medication Sig Start Date End Date Taking? Authorizing Provider  aspirin 325 MG tablet Take 1 tablet (325 mg total) by mouth daily. 12/12/15   Brittney Tresa Endo, PA-C  carvedilol (COREG) 3.125 MG tablet Take 3.125 mg by mouth 2 (two) times daily with a meal.    Historical Provider, MD  diazepam (VALIUM) 5 MG tablet Take 1 tablet by mouth daily. Take 1 tab daily 09/29/15   Historical Provider, MD  docusate sodium (COLACE) 100 MG  capsule Take 1 capsule (100 mg total) by mouth 2 (two) times daily. 12/12/15   Brittney Tresa Endo, PA-C  HYDROcodone-acetaminophen (NORCO/VICODIN) 5-325 MG per tablet Take 2 tablets by mouth every 4 (four) hours as needed for pain. 01/17/13   Ruby Cola, PA-C  levothyroxine (SYNTHROID, LEVOTHROID) 75 MCG tablet Take 1 tablet by mouth daily. 12/05/15   Historical Provider, MD  morphine (MS CONTIN) 30 MG 12 hr tablet Take 30 mg by mouth 2 (two) times daily.    Historical Provider, MD  nitroGLYCERIN (NITROSTAT) 0.4 MG SL tablet Place 1 tablet (0.4 mg total) under the tongue every 5 (five) minutes as needed. 05/01/12   Rosalio Macadamia, NP  ondansetron (ZOFRAN) 4 MG tablet Take 1 tablet (4  mg total) by mouth every 8 (eight) hours as needed for nausea or vomiting. 12/12/15   Janalee Dane, PA-C  potassium chloride SA (K-DUR,KLOR-CON) 20 MEQ tablet Take 20 mEq by mouth every morning.    Historical Provider, MD  pregabalin (LYRICA) 75 MG capsule Take 75 mg by mouth at bedtime.     Historical Provider, MD  ramipril (ALTACE) 10 MG capsule Take 10 mg by mouth daily. 10/05/15   Historical Provider, MD  simvastatin (ZOCOR) 20 MG tablet Take 20 mg by mouth every evening.    Historical Provider, MD  tiZANidine (ZANAFLEX) 4 MG tablet Take 4 mg by mouth at bedtime as needed (muscle spasm).     Historical Provider, MD  traZODone (DESYREL) 50 MG tablet Take 1 tablet by mouth at bedtime as needed for sleep. Take 1 tab daily as needed for sleep 09/15/15   Historical Provider, MD    Family History Family History  Problem Relation Age of Onset  . Heart disease    . Cancer    . HIV    . Coronary artery disease    . Hypertension    . Diabetes      Social History Social History  Substance Use Topics  . Smoking status: Former Smoker    Types: Cigarettes    Quit date: 10/14/1998  . Smokeless tobacco: Never Used  . Alcohol use No     Allergies   Patient has no known allergies.   Review of Systems Review of Systems  Constitutional: Negative for chills and fever.  HENT: Negative for sore throat.   Eyes: Negative for redness.  Respiratory: Negative for shortness of breath.   Cardiovascular: Positive for chest pain. Negative for leg swelling.  Gastrointestinal: Negative for abdominal pain and vomiting.  Genitourinary: Negative for flank pain.  Musculoskeletal: Positive for back pain. Negative for neck pain.  Skin: Negative for rash.  Neurological: Negative for headaches.  Hematological: Does not bruise/bleed easily.  Psychiatric/Behavioral: Negative for confusion.     Physical Exam Updated Vital Signs BP (!) 141/97 (BP Location: Left Arm)   Pulse 84   Temp 98.4 F (36.9 C)  (Oral)   Resp 20   SpO2 98%   Physical Exam  Constitutional: She appears well-developed and well-nourished. No distress.  HENT:  Head: Atraumatic.  Eyes: Conjunctivae are normal. No scleral icterus.  Neck: Neck supple. No tracheal deviation present.  Cardiovascular: Normal rate, regular rhythm, normal heart sounds and intact distal pulses.  Exam reveals no gallop and no friction rub.   No murmur heard. Pulmonary/Chest: Effort normal and breath sounds normal. No respiratory distress. She exhibits tenderness.  Abdominal: Soft. Normal appearance and bowel sounds are normal. She exhibits no distension and no mass. There is no tenderness.  There is no rebound and no guarding. No hernia.  Genitourinary:  Genitourinary Comments: No cva tenderness  Musculoskeletal: She exhibits no edema or tenderness.  TL spine non tender, aligned.   Neurological: She is alert.  Skin: Skin is warm and dry. No rash noted. She is not diaphoretic.  Psychiatric: She has a normal mood and affect.  Nursing note and vitals reviewed.    ED Treatments / Results  Labs (all labs ordered are listed, but only abnormal results are displayed) Results for orders placed or performed during the hospital encounter of 01/17/17  Basic metabolic panel  Result Value Ref Range   Sodium 142 135 - 145 mmol/L   Potassium 3.5 3.5 - 5.1 mmol/L   Chloride 104 101 - 111 mmol/L   CO2 32 22 - 32 mmol/L   Glucose, Bld 100 (H) 65 - 99 mg/dL   BUN 11 6 - 20 mg/dL   Creatinine, Ser 1.61 0.44 - 1.00 mg/dL   Calcium 9.0 8.9 - 09.6 mg/dL   GFR calc non Af Amer >60 >60 mL/min   GFR calc Af Amer >60 >60 mL/min   Anion gap 6 5 - 15  CBC  Result Value Ref Range   WBC 4.1 4.0 - 10.5 K/uL   RBC 3.56 (L) 3.87 - 5.11 MIL/uL   Hemoglobin 11.1 (L) 12.0 - 15.0 g/dL   HCT 04.5 (L) 40.9 - 81.1 %   MCV 97.2 78.0 - 100.0 fL   MCH 31.2 26.0 - 34.0 pg   MCHC 32.1 30.0 - 36.0 g/dL   RDW 91.4 78.2 - 95.6 %   Platelets 209 150 - 400 K/uL  I-stat  troponin, ED  Result Value Ref Range   Troponin i, poc 0.00 0.00 - 0.08 ng/mL   Comment 3           Dg Chest 2 View  Result Date: 01/17/2017 CLINICAL DATA:  Chest pain EXAM: CHEST  2 VIEW COMPARISON:  Chest radiograph 01/16/2013 FINDINGS: The heart size and mediastinal contours are within normal limits. Both lungs are clear. The right acromioclavicular interval is widened, unchanged. IMPRESSION: No active cardiopulmonary disease. Electronically Signed   By: Deatra Robinson M.D.   On: 01/17/2017 15:52    EKG  EKG Interpretation  Date/Time:  Friday January 17 2017 15:14:47 EDT Ventricular Rate:  86 PR Interval:    QRS Duration: 97 QT Interval:  390 QTC Calculation: 467 R Axis:   42 Text Interpretation:  Sinus rhythm Baseline wander in lead(s) I II aVR V3 Confirmed by Denton Lank  MD, Caryn Bee ([id number]) on 01/17/2017 4:18:44 PM       Radiology Dg Chest 2 View  Result Date: 01/17/2017 CLINICAL DATA:  Chest pain EXAM: CHEST  2 VIEW COMPARISON:  Chest radiograph 01/16/2013 FINDINGS: The heart size and mediastinal contours are within normal limits. Both lungs are clear. The right acromioclavicular interval is widened, unchanged. IMPRESSION: No active cardiopulmonary disease. Electronically Signed   By: Deatra Robinson M.D.   On: 01/17/2017 15:52    Procedures Procedures (including critical care time)  Medications Ordered in ED Medications  ibuprofen (ADVIL,MOTRIN) tablet 400 mg (not administered)  HYDROcodone-acetaminophen (NORCO/VICODIN) 5-325 MG per tablet 1 tablet (not administered)     Initial Impression / Assessment and Plan / ED Course  I have reviewed the triage vital signs and the nursing notes.  Pertinent labs & imaging results that were available during my care of the patient were reviewed by me and considered in my medical decision  making (see chart for details).   No meds pta.  Motrin po. Hydrocodone po.  Labs. cxr.  Reviewed nursing notes and prior charts for additional  history.   Reviewed prior charts, cath 2013 with no signif cad.  After symptoms for 1-2 weeks, constant, trop 0.  Pt mildly anxious.  Ativan po.  Will try gi meds for symptom relief.  Recheck breathing comfortably. Hr 68, rr 14, pulse ox 99%.   Pt currently appears stable for d/c.    Final Clinical Impressions(s) / ED Diagnoses   Final diagnoses:  None    New Prescriptions New Prescriptions   No medications on file     Cathren Laine, MD 01/17/17 1729

## 2017-01-17 NOTE — ED Triage Notes (Signed)
Pt complains of chest pain and pain between shoulder blades since 3/23. Pt also complains of shortness of breath for the past several days.

## 2017-01-17 NOTE — Discharge Instructions (Signed)
It was our pleasure to provide your ER care today - we hope that you feel better.  You may try GI medication such as pepcid and maalox as need for symptom relief.  You may take ultram as need for pain - no driving when taking.  Follow up with your doctor/cardiologist in the coming week.  Return to ER if worse, trouble breathing, worsening or severe pain, other concern.  You were given pain medication in the ER - no driving for the next 6 hours.

## 2017-01-17 NOTE — ED Notes (Signed)
Patient transported to X-ray 

## 2017-08-11 ENCOUNTER — Other Ambulatory Visit (HOSPITAL_COMMUNITY): Payer: Self-pay | Admitting: Orthopedic Surgery

## 2017-08-11 ENCOUNTER — Ambulatory Visit (HOSPITAL_COMMUNITY)
Admission: RE | Admit: 2017-08-11 | Discharge: 2017-08-11 | Disposition: A | Discharge status: Home / Self Care | Payer: Medicare Other | Source: Ambulatory Visit | Attending: Orthopedic Surgery | Admitting: Orthopedic Surgery

## 2017-08-11 DIAGNOSIS — M79605 Pain in left leg: Secondary | ICD-10-CM | POA: Diagnosis not present

## 2017-08-11 DIAGNOSIS — M7989 Other specified soft tissue disorders: Principal | ICD-10-CM

## 2017-08-11 NOTE — Progress Notes (Signed)
*  PRELIMINARY RESULTS* Vascular Ultrasound Left lower extremity venous duplex has been completed.  Preliminary findings: No evidence of deep vein thrombosis or baker's cyst in the left lower extremity.  Preliminary results called to Dr. Renaye Rakers @ 16:15, given to [place].  Please see CV Proc or results review tab for prelim results in the future.   Chauncey Fischer 08/11/2017, 4:14 PM

## 2017-10-30 ENCOUNTER — Telehealth: Payer: Self-pay | Admitting: *Deleted

## 2017-10-30 NOTE — Telephone Encounter (Signed)
   [facility] Pre-operative Risk Assessment    Request for surgical clearance:  1. What type of surgery is being performed? LEFT PARTIAL KNEE REPLACEMENT    2. When is this surgery scheduled? 01/06/2018   3. Are there any medications that need to be held prior to surgery and how long?  4. Practice name and name of physician performing surgery? [facility]    5. What is your office phone and fax number? PHONE [phone] EXT [place]    6. Anesthesia type (None, local, MAC, general) ? UNKNOWN

## 2017-10-31 NOTE — Telephone Encounter (Signed)
   Primary Cardiologist: Dr. [place]  Chart reviewed as part of pre-operative protocol coverage. Because of Mykell R Galas's past medical history and time since last visit, he/she will require a follow-up visit in order to better assess preoperative cardiovascular risk.  Pre-op covering staff: - Please schedule appointment and call patient to inform them. - Please contact requesting surgeon's office via preferred method (i.e, phone, fax) to inform them of need for appointment prior to surgery.  Robbie Lis, PA-C  10/31/2017, 3:01 PM

## 2017-10-31 NOTE — Telephone Encounter (Signed)
Per Boyce Medici, PA pt has not been seen since 2017 and will need an appt before she can be cleared for her surgery.

## 2017-11-03 NOTE — Telephone Encounter (Signed)
Called pt to make her aware that she would need to be seen in order to be cleared for her Knee Replacement, in March.  Pt has been scheduled to see Dr. Seward Grater 11/19/17. Pt thanked me for the call.

## 2017-11-18 NOTE — Progress Notes (Deleted)
Cardiology Office Note   Date:  11/18/2017   ID:  Tracy Buckley, DOB December 27, 1950, MRN [id number]  PCP:  Burton Apley, MD  Cardiologist:   Chasity Outten [place], MD   No chief complaint on file.     History of Present Illness: Tracy Buckley is a 67 y.o. female who presents for cardiac follow up and pre op clearance for knee surgery. Last seen in January 2017. Marland Kitchen  She has a history of some type of cardiomyopathy when seen by Dr. [name] in 2002. She then moved to [place] and was followed by a cardiologist there. In 2010 I saw her and nuclear stress test and Echo were normal. In July 2013 she was admitted with chest pain and cardiac cath showed mild nonobstructive CAD. She has chronic chest pain. This is described as a sharp stabbing pain in the center of her chest. It may occur at rest. Not exertional. Exacerbated by stress. She does take Ntg for the pain. Reports that 2 weeks ago she was having pain every day but since then pain has resolved. Myoview study in 2017 was normal.     Past Medical History:  Diagnosis Date  . Arthritis   . Chronic chest pain   . Depression    Admission for suicidal ideation 2001  . Diverticulitis   . Essential hypertension, benign   . Fibromyalgia   . Hypercholesteremia   . Hypothyroidism   . Nonischemic cardiomyopathy (HCC)     No obstructive disease at catheterization 2002 - Dr. [name] and in July of 2013. EF is 50 to 55%. Grade 1 diastolic dysfunction noted on echo 04/2012  . Obesity   . Peripheral vascular disease (HCC)   . Polysubstance abuse     Remote history  . Shortness of breath dyspnea with exertion    Past Surgical History:  Procedure Laterality Date  . ABDOMINAL HYSTERECTOMY    . BACK SURGERY     x2  . CESAREAN SECTION    . HAND SURGERY     x2  . Hysterectomy - unknown type    . KIDNEY STONE SURGERY    . LEFT HEART CATHETERIZATION WITH CORONARY ANGIOGRAM N/A 04/17/2012   Procedure: LEFT HEART CATHETERIZATION WITH CORONARY  ANGIOGRAM;  Surgeon: Kathleene Hazel, MD;  Location: [facility] CATH LAB;  Service: Cardiovascular;  Laterality: N/A;  . PARTIAL KNEE ARTHROPLASTY Right 12/12/2015   Procedure: RIGHT UNICOMPARTMENTAL KNEE;  Surgeon: Sheral Apley, MD;  Location: [facility] OR;  Service: Orthopedics;  Laterality: Right;  . SHOULDER SURGERY    . Tummy tuck  2009     Current Outpatient Medications  Medication Sig Dispense Refill  . aspirin 325 MG tablet Take 1 tablet (325 mg total) by mouth daily. 30 tablet 0  . carvedilol (COREG) 3.125 MG tablet Take 3.125 mg by mouth 2 (two) times daily with a meal.    . diazepam (VALIUM) 5 MG tablet Take 1 tablet by mouth daily. Take 1 tab daily  2  . docusate sodium (COLACE) 100 MG capsule Take 1 capsule (100 mg total) by mouth 2 (two) times daily. 10 capsule 0  . HYDROcodone-acetaminophen (NORCO/VICODIN) 5-325 MG per tablet Take 2 tablets by mouth every 4 (four) hours as needed for pain. 20 tablet 0  . levothyroxine (SYNTHROID, LEVOTHROID) 75 MCG tablet Take 1 tablet by mouth daily.  2  . morphine (MS CONTIN) 30 MG 12 hr tablet Take 30 mg by mouth 2 (two) times daily.    Marland Kitchen  nitroGLYCERIN (NITROSTAT) 0.4 MG SL tablet Place 1 tablet (0.4 mg total) under the tongue every 5 (five) minutes as needed. 25 tablet 6  . ondansetron (ZOFRAN) 4 MG tablet Take 1 tablet (4 mg total) by mouth every 8 (eight) hours as needed for nausea or vomiting. 20 tablet 0  . potassium chloride SA (K-DUR,KLOR-CON) 20 MEQ tablet Take 20 mEq by mouth every morning.    . pregabalin (LYRICA) 75 MG capsule Take 75 mg by mouth at bedtime.     . ramipril (ALTACE) 10 MG capsule Take 10 mg by mouth daily.  4  . simvastatin (ZOCOR) 20 MG tablet Take 20 mg by mouth every evening.    Marland Kitchen tiZANidine (ZANAFLEX) 4 MG tablet Take 4 mg by mouth at bedtime as needed (muscle spasm).     . traMADol (ULTRAM) 50 MG tablet Take 1 tablet (50 mg total) by mouth every 6 (six) hours as needed. 15 tablet 0  . traZODone (DESYREL) 50 MG  tablet Take 1 tablet by mouth at bedtime as needed for sleep. Take 1 tab daily as needed for sleep  4   No current facility-administered medications for this visit.     Allergies:   Patient has no known allergies.    Social History:  The patient  reports that she quit smoking about 19 years ago. Her smoking use included cigarettes. she has never used smokeless tobacco. She reports that she does not drink alcohol or use drugs.   Family History:  The patient's family history is negative for CAD.    ROS:  Please see the history of present illness.   Otherwise, review of systems are positive for none.   All other systems are reviewed and negative.    PHYSICAL EXAM: VS:  There were no vitals taken for this visit. , BMI There is no height or weight on file to calculate BMI. GEN: Morbidly obese BF, in no acute distress  HEENT: normal  Neck: no JVD, carotid bruits, or masses Cardiac: RRR; no murmurs, rubs, or gallops,no edema  Respiratory:  clear to auscultation bilaterally, normal work of breathing GI: soft, nontender, nondistended, + BS MS: no deformity or atrophy  Skin: warm and dry, no rash Neuro:  Strength and sensation are intact Psych: euthymic mood, full affect   EKG:  EKG is ordered today. The ekg ordered today demonstrates NSR with Mild T wave inversion in the lateral leads. I have personally reviewed and interpreted this study.    Recent Labs: 01/17/2017: BUN 11; Creatinine, Ser 0.81; Hemoglobin 11.1; Platelets 209; Potassium 3.5; Sodium 142    Lipid Panel    Component Value Date/Time   CHOL 173 04/16/2012 0030   TRIG 200 (H) 04/16/2012 0030   HDL 63 04/16/2012 0030   CHOLHDL 2.7 04/16/2012 0030   VLDL 40 04/16/2012 0030   LDLCALC 70 04/16/2012 0030      Wt Readings from Last 3 Encounters:  12/12/15 205 lb 14.6 oz (93.4 kg)  12/01/15 205 lb 14.6 oz (93.4 kg)  11/09/15 203 lb (92.1 kg)      Other studies Reviewed: Additional studies/ records that were  reviewed today include: None Review of the above records demonstrates: N/A  Myoview 11/09/15: Study Highlights    The left ventricular ejection fraction is normal (55-65%).  Nuclear stress EF: 60%.  There was no ST segment deviation noted during stress.  The study is normal.  This is a low risk study.  Normal myocardial perfusion.   Normal myocardial  perfusion and function; EF 60%.    ASSESSMENT AND PLAN:  1.  Atypical chest pain. 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 Given recent chest pain I have recommended a Lexiscan Myoview study to assess current cardiac risk. If low risk she can proceed with knee surgery.   2. HTN controlled.  3. Hypercholesterolemia. On statin.   4. Osteoarthritis of the knee.    Current medicines are reviewed at length with the patient today.  The patient does not have concerns regarding medicines.  The following changes have been made:  no change  Labs/ tests ordered today include:  No orders of the defined types were placed in this encounter.   Lexiscan Myoview.   Disposition:   FU with TBA   Signed, Anthonette Lesage [place], MD  11/18/2017 7:51 AM    [facility] [address], [place], [place], [address] Phone [phone], Fax [phone]

## 2017-11-19 ENCOUNTER — Telehealth: Payer: Self-pay | Admitting: Cardiology

## 2017-11-19 ENCOUNTER — Ambulatory Visit: Payer: Medicare Other | Admitting: Cardiology

## 2017-12-04 NOTE — Progress Notes (Deleted)
Cardiology Office Note   Date:  12/04/2017   ID:  Tracy Buckley, DOB 1951-10-02, MRN [id number]  PCP:  Burton Apley, MD  Cardiologist:   Carlisle Torgeson [place], MD   No chief complaint on file.     History of Present Illness: Tracy Buckley is a 67 y.o. female who presents for cardiac follow up and pre op clearance for knee surgery. Last seen in January 2017. Marland Kitchen  She has a history of some type of cardiomyopathy when seen by Dr. [name] in 2002. She then moved to [place] and was followed by a cardiologist there. In 2010 I saw her and nuclear stress test and Echo were normal. In July 2013 she was admitted with chest pain and cardiac cath showed mild nonobstructive CAD. She has chronic chest pain. This is described as a sharp stabbing pain in the center of her chest. It may occur at rest. Not exertional. Exacerbated by stress. She does take Ntg for the pain. Reports that 2 weeks ago she was having pain every day but since then pain has resolved. Myoview study in 2017 was normal.     Past Medical History:  Diagnosis Date  . Arthritis   . Chronic chest pain   . Depression    Admission for suicidal ideation 2001  . Diverticulitis   . Essential hypertension, benign   . Fibromyalgia   . Hypercholesteremia   . Hypothyroidism   . Nonischemic cardiomyopathy (HCC)     No obstructive disease at catheterization 2002 - Dr. [name] and in July of 2013. EF is 50 to 55%. Grade 1 diastolic dysfunction noted on echo 04/2012  . Obesity   . Peripheral vascular disease (HCC)   . Polysubstance abuse     Remote history  . Shortness of breath dyspnea with exertion    Past Surgical History:  Procedure Laterality Date  . ABDOMINAL HYSTERECTOMY    . BACK SURGERY     x2  . CESAREAN SECTION    . HAND SURGERY     x2  . Hysterectomy - unknown type    . KIDNEY STONE SURGERY    . LEFT HEART CATHETERIZATION WITH CORONARY ANGIOGRAM N/A 04/17/2012   Procedure: LEFT HEART CATHETERIZATION WITH CORONARY  ANGIOGRAM;  Surgeon: Kathleene Hazel, MD;  Location: [facility] CATH LAB;  Service: Cardiovascular;  Laterality: N/A;  . PARTIAL KNEE ARTHROPLASTY Right 12/12/2015   Procedure: RIGHT UNICOMPARTMENTAL KNEE;  Surgeon: Sheral Apley, MD;  Location: [facility] OR;  Service: Orthopedics;  Laterality: Right;  . SHOULDER SURGERY    . Tummy tuck  2009     Current Outpatient Medications  Medication Sig Dispense Refill  . aspirin 325 MG tablet Take 1 tablet (325 mg total) by mouth daily. 30 tablet 0  . carvedilol (COREG) 3.125 MG tablet Take 3.125 mg by mouth 2 (two) times daily with a meal.    . diazepam (VALIUM) 5 MG tablet Take 1 tablet by mouth daily. Take 1 tab daily  2  . docusate sodium (COLACE) 100 MG capsule Take 1 capsule (100 mg total) by mouth 2 (two) times daily. 10 capsule 0  . HYDROcodone-acetaminophen (NORCO/VICODIN) 5-325 MG per tablet Take 2 tablets by mouth every 4 (four) hours as needed for pain. 20 tablet 0  . levothyroxine (SYNTHROID, LEVOTHROID) 75 MCG tablet Take 1 tablet by mouth daily.  2  . morphine (MS CONTIN) 30 MG 12 hr tablet Take 30 mg by mouth 2 (two) times daily.    Marland Kitchen  nitroGLYCERIN (NITROSTAT) 0.4 MG SL tablet Place 1 tablet (0.4 mg total) under the tongue every 5 (five) minutes as needed. 25 tablet 6  . ondansetron (ZOFRAN) 4 MG tablet Take 1 tablet (4 mg total) by mouth every 8 (eight) hours as needed for nausea or vomiting. 20 tablet 0  . potassium chloride SA (K-DUR,KLOR-CON) 20 MEQ tablet Take 20 mEq by mouth every morning.    . pregabalin (LYRICA) 75 MG capsule Take 75 mg by mouth at bedtime.     . ramipril (ALTACE) 10 MG capsule Take 10 mg by mouth daily.  4  . simvastatin (ZOCOR) 20 MG tablet Take 20 mg by mouth every evening.    Marland Kitchen tiZANidine (ZANAFLEX) 4 MG tablet Take 4 mg by mouth at bedtime as needed (muscle spasm).     . traMADol (ULTRAM) 50 MG tablet Take 1 tablet (50 mg total) by mouth every 6 (six) hours as needed. 15 tablet 0  . traZODone (DESYREL) 50 MG  tablet Take 1 tablet by mouth at bedtime as needed for sleep. Take 1 tab daily as needed for sleep  4   No current facility-administered medications for this visit.     Allergies:   Patient has no known allergies.    Social History:  The patient  reports that she quit smoking about 19 years ago. Her smoking use included cigarettes. she has never used smokeless tobacco. She reports that she does not drink alcohol or use drugs.   Family History:  The patient's family history is negative for CAD.    ROS:  Please see the history of present illness.   Otherwise, review of systems are positive for none.   All other systems are reviewed and negative.    PHYSICAL EXAM: VS:  There were no vitals taken for this visit. , BMI There is no height or weight on file to calculate BMI. GEN: Morbidly obese BF, in no acute distress  HEENT: normal  Neck: no JVD, carotid bruits, or masses Cardiac: RRR; no murmurs, rubs, or gallops,no edema  Respiratory:  clear to auscultation bilaterally, normal work of breathing GI: soft, nontender, nondistended, + BS MS: no deformity or atrophy  Skin: warm and dry, no rash Neuro:  Strength and sensation are intact Psych: euthymic mood, full affect   EKG:  EKG is ordered today. The ekg ordered today demonstrates NSR with Mild T wave inversion in the lateral leads. I have personally reviewed and interpreted this study.    Recent Labs: 01/17/2017: BUN 11; Creatinine, Ser 0.81; Hemoglobin 11.1; Platelets 209; Potassium 3.5; Sodium 142    Lipid Panel    Component Value Date/Time   CHOL 173 04/16/2012 0030   TRIG 200 (H) 04/16/2012 0030   HDL 63 04/16/2012 0030   CHOLHDL 2.7 04/16/2012 0030   VLDL 40 04/16/2012 0030   LDLCALC 70 04/16/2012 0030      Wt Readings from Last 3 Encounters:  12/12/15 205 lb 14.6 oz (93.4 kg)  12/01/15 205 lb 14.6 oz (93.4 kg)  11/09/15 203 lb (92.1 kg)      Other studies Reviewed: Additional studies/ records that were  reviewed today include: None Review of the above records demonstrates: N/A  Myoview 11/09/15: Study Highlights    The left ventricular ejection fraction is normal (55-65%).  Nuclear stress EF: 60%.  There was no ST segment deviation noted during stress.  The study is normal.  This is a low risk study.  Normal myocardial perfusion.   Normal myocardial  perfusion and function; EF 60%.    ASSESSMENT AND PLAN:  1.  Atypical chest pain. 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 Given recent chest pain I have recommended a Lexiscan Myoview study to assess current cardiac risk. If low risk she can proceed with knee surgery.   2. HTN controlled.  3. Hypercholesterolemia. On statin.   4. Osteoarthritis of the knee.    Current medicines are reviewed at length with the patient today.  The patient does not have concerns regarding medicines.  The following changes have been made:  no change  Labs/ tests ordered today include:  No orders of the defined types were placed in this encounter.   Lexiscan Myoview.   Disposition:   FU with TBA   Signed, Daneen Volcy [place], MD  12/04/2017 11:43 AM    [facility] [address], [place], [place], [address] Phone [phone], Fax [phone]

## 2017-12-08 ENCOUNTER — Ambulatory Visit: Payer: Medicare Other | Admitting: Cardiology

## 2017-12-10 ENCOUNTER — Encounter: Payer: Self-pay | Admitting: Cardiology

## 2017-12-10 IMAGING — NM NM MISC PROCEDURE
6 series · 36 of 36 positions shown · non-contrast
Comparison: none

[Series 1: wbr rest · 6.40mm/px · 6 of 64 frames shown]
[frame 6/64]
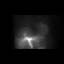
[frame 16/64]
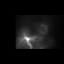
[frame 27/64]
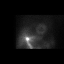
[frame 38/64]
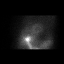
[frame 48/64]
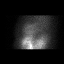
[frame 59/64]
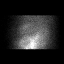

[Series 1: wbr_r-proj_st wbr rest · 6.40mm/px · 6 of 64 frames shown]
[frame 6/64]
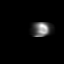
[frame 16/64]
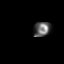
[frame 27/64]
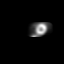
[frame 38/64]
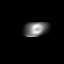
[frame 48/64]
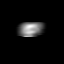
[frame 59/64]
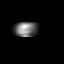

[Series 2: wbr_s-proj_st wbr stress-gsp · 6.40mm/px · 6 of 512 frames shown]
[frame 43/512]
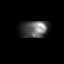
[frame 128/512]
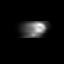
[frame 214/512]
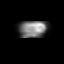
[frame 299/512]
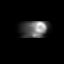
[frame 384/512]
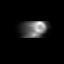
[frame 470/512]
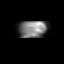

[Series 2: wbr stress-gsp · 6.40mm/px · 6 of 511 frames shown]
[frame 43/511]
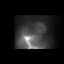
[frame 128/511]
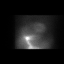
[frame 213/511]
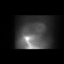
[frame 298/511]
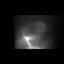
[frame 383/511]
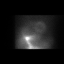
[frame 469/511]
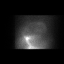

[Series 3: wbr stress-sum-em · 6.40mm/px · 6 of 64 frames shown]
[frame 6/64]
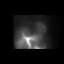
[frame 16/64]
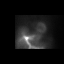
[frame 27/64]
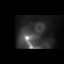
[frame 38/64]
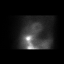
[frame 48/64]
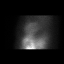
[frame 59/64]
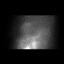

[Series 3: wbr_s-proj_st wbr stress-sum-em · 6.40mm/px · 6 of 64 frames shown]
[frame 6/64]
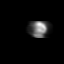
[frame 16/64]
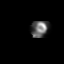
[frame 27/64]
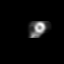
[frame 38/64]
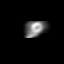
[frame 48/64]
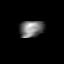
[frame 59/64]
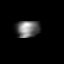

[36 of 36 positions shown; findings below may reference images not displayed]

Canned report from images found in remote index.

Refer to host system for actual result text.

## 2017-12-10 NOTE — Telephone Encounter (Signed)
Close encounter 

## 2017-12-15 DIAGNOSIS — G8929 Other chronic pain: Secondary | ICD-10-CM | POA: Diagnosis present

## 2017-12-15 DIAGNOSIS — M1712 Unilateral primary osteoarthritis, left knee: Secondary | ICD-10-CM | POA: Diagnosis present

## 2017-12-15 NOTE — H&P (Addendum)
PREOPERATIVE H&P Patient ID: EVERLIE EBLE MRN: [id number] DOB/AGE: [birth year] 67 y.o.  Chief Complaint: OA LEFT KNEE  Planned Procedure Date: 01/06/2018 Medical Clearance by Dr. Su Hilt pending Cardiac Clearance by Dr. [place]and pending   HPI: ALONZO LOVING is a 67 y.o. female with a history of nonischemic cardiomyopathy, hyperlipidemia, chronic pain managed with narcotic medicines and history of multiple lumbar spine surgeries.  Right unicompartmental knee arthroplasty 11/2015.  She presents for evaluation of OA LEFT KNEE. The patient has a history of pain and functional disability in the left knee due to arthritis and has failed non-surgical conservative treatments for greater than 12 weeks to include NSAID's and/or analgesics, corticosteriod injections, viscosupplementation injections, use of assistive devices and activity modification.  Onset of symptoms was gradual, starting 3 years ago with gradually worsening course since that time.  Patient currently rates pain at 8 out of 10 with activity. Patient has night pain, worsening of pain with activity and weight bearing and pain that interferes with activities of daily living.  Patient has evidence of subchondral cysts, periarticular osteophytes and joint space narrowing by imaging studies.  There is no active infection.  Past Medical History:  Diagnosis Date  . Arthritis   . Chronic chest pain   . Depression    Admission for suicidal ideation 2001  . Diverticulitis   . Essential hypertension, benign   . Fibromyalgia   . Hypercholesteremia   . Hypothyroidism   . Nonischemic cardiomyopathy (HCC)     No obstructive disease at catheterization 2002 - Dr. [name] and in July of 2013. EF is 50 to 55%. Grade 1 diastolic dysfunction noted on echo 04/2012  . Obesity   . Peripheral vascular disease (HCC)   . Polysubstance abuse     Remote history  . Shortness of breath dyspnea with exertion   Past Surgical History:  Procedure Laterality  Date  . ABDOMINAL HYSTERECTOMY    . BACK SURGERY     x2  . CESAREAN SECTION    . HAND SURGERY     x2  . Hysterectomy - unknown type    . KIDNEY STONE SURGERY    . LEFT HEART CATHETERIZATION WITH CORONARY ANGIOGRAM N/A 04/17/2012   Procedure: LEFT HEART CATHETERIZATION WITH CORONARY ANGIOGRAM;  Surgeon: Kathleene Hazel, MD;  Location: [facility] CATH LAB;  Service: Cardiovascular;  Laterality: N/A;  . PARTIAL KNEE ARTHROPLASTY Right 12/12/2015   Procedure: RIGHT UNICOMPARTMENTAL KNEE;  Surgeon: Sheral Apley, MD;  Location: [facility] OR;  Service: Orthopedics;  Laterality: Right;  . SHOULDER SURGERY    . Tummy tuck  2009   No Known Allergies   Medications: Altace 10 mg daily Oxycodone 30 mg 3 times daily Morphine 15 mg twice daily ASA 81 mg daily Carvedilol 3.125 mg twice daily Trazodone 50 mg to at bedtime PRN sleep Tizanidine 4 mg as needed spasm bedtime Diazepam 5 mg daily  Colace 100 mg twice daily Levothyroxine 75 mcg daily Nitrostat 0.4 mg SL as needed K-Dur 20 mEq every morning Simvastatin 20 mg daily  Social history: Divorced former smoker.  Smoked for 5 years-quit 20 years ago.  No EtOH.  Family history: Mother with HTN, DM, cancer.  Father with HTN, heart disease.  Brother with HTN.  Sister with heart disease, HTN.  ROS: Currently denies lightheadedness, dizziness, Fever, chills, CP, SOB.  No personal history of DVT, PE, MI or CVA. No loose teeth.  She does have dentures but does have some native teeth.  She denies infection.  All other systems have been reviewed and were otherwise currently negative with the exception of those mentioned in the HPI and as above.  Objective: Vitals: Ht: 5 feet 4 inches wt: 221 temp: 97.8 BP: 99/69 pulse: 79 O2 89 % on room air. Physical Exam: General: Alert, NAD.  Antalgic Gait.  Utilizes a cane. HEENT: EOMI, Good Neck Extension  Pulm: No increased work of breathing.  Clear B/L A/P w/o crackle or wheeze.  CV: RRR, No m/g/r appreciated   GI: soft, NT, ND Neuro: Neuro without gross focal deficit.  Sensation intact distally Skin: No lesions in the area of chief complaint MSK/Surgical Site: Left knee w/o redness or effusion.  Positive medial JLT. ROM 0-110.  5/5 strength in extension and flexion.  +EHL/FHL.  NVI.  Stable varus and valgus stress.    Imaging Review Plain radiographs demonstrate severe degenerative joint disease of the left knee.   Assessment: OA LEFT KNEE Principal Problem:   Primary osteoarthritis of left knee Active Problems:   Nonischemic cardiomyopathy (HCC)   Essential hypertension, benign   Mixed hyperlipidemia   Chronic pain   Plan: Plan for Procedure(s): UNICOMPARTMENTAL KNEE  The patient history, physical exam, clinical judgement of the provider and imaging are consistent with end stage degenerative joint disease and unicompartmental joint arthroplasty is deemed medically necessary. The treatment options including medical management, injection therapy, and arthroplasty were discussed at length. The risks and benefits of Procedure(s): UNICOMPARTMENTAL KNEE were presented and reviewed.  The risks of nonoperative treatment, versus surgical intervention including but not limited to continued pain, aseptic loosening, stiffness, dislocation/subluxation, infection, bleeding, nerve injury, blood clots, cardiopulmonary complications, morbidity, mortality, among others were discussed. The patient verbalizes understanding and wishes to proceed with the plan.  Patient is being admitted for inpatient treatment for surgery, pain control, PT, OT, prophylactic antibiotics, VTE prophylaxis, progressive ambulation, ADL's and discharge planning.   Dental prophylaxis discussed and recommended for 2 years postoperatively.   The patient does meet the criteria for TXA which will be used perioperatively.    ASA 325 mg will be used postoperatively for DVT prophylaxis in addition to SCDs, and early  ambulation.  Continue chronic pain medicine postoperatively.  Currently taking Lyrica as well.  Patient to discuss NSAID use postoperatively and holding ASA 81 mg at upcoming visit w/ Dr. Swaziland.  Dr. Su Hilt will be managing her post-operative pain medications.  The patient is planning to be discharged home with home health services ([facility]) in care of  her sister.  Severity of Illness: The appropriate patient status for this patient is OBSERVATION. Observation status is judged to be reasonable and necessary in order to provide the required intensity of service to ensure the patient's safety. The patient's presenting symptoms, physical exam findings, and initial radiographic and laboratory data in the context of their medical condition is felt to place them at decreased risk for further clinical deterioration. Furthermore, it is anticipated that the patient will be medically stable for discharge from the hospital within 2 midnights of admission. The following factors support the patient status of observation.    Albina Billet III, PA-C 12/15/2017 4:07 PM

## 2017-12-23 ENCOUNTER — Encounter: Payer: Self-pay | Admitting: Physician Assistant

## 2017-12-23 ENCOUNTER — Ambulatory Visit (INDEPENDENT_AMBULATORY_CARE_PROVIDER_SITE_OTHER): Payer: Medicare Other | Admitting: Physician Assistant

## 2017-12-23 VITALS — BP 112/72 | HR 74 | Ht 62.0 in | Wt 213.0 lb

## 2017-12-23 DIAGNOSIS — R079 Chest pain, unspecified: Secondary | ICD-10-CM | POA: Diagnosis not present

## 2017-12-23 DIAGNOSIS — E785 Hyperlipidemia, unspecified: Secondary | ICD-10-CM

## 2017-12-23 DIAGNOSIS — E039 Hypothyroidism, unspecified: Secondary | ICD-10-CM | POA: Diagnosis not present

## 2017-12-23 DIAGNOSIS — I428 Other cardiomyopathies: Secondary | ICD-10-CM | POA: Diagnosis not present

## 2017-12-23 DIAGNOSIS — Z01810 Encounter for preprocedural cardiovascular examination: Secondary | ICD-10-CM | POA: Diagnosis not present

## 2017-12-23 DIAGNOSIS — I10 Essential (primary) hypertension: Secondary | ICD-10-CM

## 2017-12-23 NOTE — Patient Instructions (Addendum)
Medication Instructions:  Your physician recommends that you continue on your current medications as directed. Please refer to the Current Medication list given to you today.  Labwork: None   Testing/Procedures: None   Follow-Up: Your physician wants you to follow-up in: 6 months with Dr Swaziland. You will receive a reminder letter in the mail two months in advance. If you don't receive a letter, please call our office to schedule the follow-up appointment.  Any Other Special Instructions Will Be Listed Below (If Applicable). If you need a refill on your cardiac medications before your next appointment, please call your pharmacy.

## 2017-12-23 NOTE — Progress Notes (Signed)
Cardiology Office Note    Date:  12/23/2017   ID:  Tracy Buckley, DOB July 21, 1951, MRN [id number]  PCP:  Burton Apley, MD  Cardiologist:  Dr. Swaziland  Chief Complaint  Patient presents with  . Medical Clearance    Knee surgery March 26th with Dr [name], pt denied chest pain    History of Present Illness:  Tracy Buckley is a 67 y.o. female with past medical history of hypothyroidism, hyperlipidemia, hypertension, NICM, and PAD.  She was remotely seen by Dr. [name] in 2002 for cardiomyopathy.  She then moved to [place] and followed by a cardiologist over there.  She had a normal nuclear stress test and echocardiogram in 2010.  In July 2013 she was admitted with chest pain and a cardiac catheterization at the time showed mild nonobstructive CAD.  Echocardiogram obtained on 04/16/2012 also showed normal EF with mild MR.  She was last seen by Dr. Swaziland on 11/07/2015 for preoperative clearance, she still had a very atypical chest pain at the time, therefore Lexiscan Myoview was recommended.  Myoview obtained on 11/09/2015 showed EF 60%, normal perfusion, overall low risk study.  Patient presents today for preop clearance prior to her left knee surgery by Dr. [name].  She continued to have left-sided chest pain.  This has been unchanged in characteristic and frequency for the past 7 years.  This is her usual chest pain.  The last episode was more than a month ago.  She says her chest pain is worse with emotional stress or exertion.  EKG today showed only minimal T wave inversion in lead I and aVL.  Since her chest pain has not changed in characteristic, I would not recommend any further workup in this case.  I discussed the case with [name], who also agrees.   Past Medical History:  Diagnosis Date  . Arthritis   . Chronic chest pain   . Depression    Admission for suicidal ideation 2001  . Diverticulitis   . Essential hypertension, benign   . Fibromyalgia   .  Hypercholesteremia   . Hypothyroidism   . Nonischemic cardiomyopathy (HCC)     No obstructive disease at catheterization 2002 - Dr. [name] and in July of 2013. EF is 50 to 55%. Grade 1 diastolic dysfunction noted on echo 04/2012  . Obesity   . Peripheral vascular disease (HCC)   . Polysubstance abuse (HCC)     Remote history  . Shortness of breath dyspnea with exertion    Past Surgical History:  Procedure Laterality Date  . ABDOMINAL HYSTERECTOMY    . BACK SURGERY     x2  . CESAREAN SECTION    . HAND SURGERY     x2  . Hysterectomy - unknown type    . KIDNEY STONE SURGERY    . LEFT HEART CATHETERIZATION WITH CORONARY ANGIOGRAM N/A 04/17/2012   Procedure: LEFT HEART CATHETERIZATION WITH CORONARY ANGIOGRAM;  Surgeon: Kathleene Hazel, MD;  Location: [facility] CATH LAB;  Service: Cardiovascular;  Laterality: N/A;  . PARTIAL KNEE ARTHROPLASTY Right 12/12/2015   Procedure: RIGHT UNICOMPARTMENTAL KNEE;  Surgeon: Sheral Apley, MD;  Location: [facility] OR;  Service: Orthopedics;  Laterality: Right;  . SHOULDER SURGERY    . Tummy tuck  2009    Current Medications: Outpatient Medications Prior to Visit  Medication Sig Dispense Refill  . aspirin EC 81 MG tablet Take 81 mg by mouth daily.    . carvedilol (COREG) 3.125 MG tablet Take 3.125  mg by mouth 2 (two) times daily with a meal.    . diazepam (VALIUM) 5 MG tablet Take 5 mg by mouth 2 (two) times daily.   2  . levothyroxine (SYNTHROID, LEVOTHROID) 75 MCG tablet Take 75 mcg by mouth daily before breakfast.   2  . morphine (MSIR) 15 MG tablet Take 15 mg by mouth 2 (two) times daily as needed for severe pain (for pain.).    Marland Kitchen nitroGLYCERIN (NITROSTAT) 0.4 MG SL tablet Place 1 tablet (0.4 mg total) under the tongue every 5 (five) minutes as needed. (Patient taking differently: Place 0.4 mg under the tongue every 5 (five) minutes as needed for chest pain. ) 25 tablet 6  . ondansetron (ZOFRAN) 4 MG tablet Take 1 tablet (4 mg total) by mouth every 8  (eight) hours as needed for nausea or vomiting. 20 tablet 0  . oxycodone (ROXICODONE) 30 MG immediate release tablet Take 30 mg by mouth 3 (three) times daily.  0  . polyethylene glycol powder (GLYCOLAX/MIRALAX) powder Take 17 g by mouth daily.    . pregabalin (LYRICA) 150 MG capsule Take 150 mg by mouth at bedtime.    . ramipril (ALTACE) 10 MG capsule Take 10 mg by mouth daily.  4  . simvastatin (ZOCOR) 20 MG tablet Take 20 mg by mouth at bedtime.     Marland Kitchen tiZANidine (ZANAFLEX) 4 MG tablet Take 4 mg by mouth 4 (four) times daily.     . traMADol (ULTRAM) 50 MG tablet Take 1 tablet (50 mg total) by mouth every 6 (six) hours as needed. (Patient taking differently: Take 50 mg by mouth every 6 (six) hours as needed (for pain.). ) 15 tablet 0  . traZODone (DESYREL) 50 MG tablet Take 50 mg by mouth at bedtime.   4   No facility-administered medications prior to visit.      Allergies:   Patient has no known allergies.   Social History   Socioeconomic History  . Marital status: Married    Spouse name: None  . Number of children: 4  . Years of education: None  . Highest education level: None  Social Needs  . Financial resource strain: None  . Food insecurity - worry: None  . Food insecurity - inability: None  . Transportation needs - medical: None  . Transportation needs - non-medical: None  Occupational History  . 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 Smoking status: Former Smoker    Types: Cigarettes    Last attempt to quit: 10/14/1998    Years since quitting: 19.2  . Smokeless tobacco: Never Used  Substance and Sexual Activity  . Alcohol use: No  . Drug use: No  . Sexual activity: None  Other Topics Concern  . None  Social History Narrative  . None     Family History:  The patient's family history includes Cancer in her unknown relative; Coronary artery disease in her unknown relative; Diabetes in her unknown relative; HIV in her unknown  relative; Heart disease in her unknown relative; Hypertension in her unknown relative.   ROS:   Please see the history of present illness.    ROS All other systems reviewed and are negative.   PHYSICAL EXAM:   VS:  BP 112/72   Pulse 74   Ht 5\' 2"  (1.575 m)   Wt 213 lb (96.6 kg)   BMI 38.96 kg/m    GEN: Well nourished, well developed, in  no acute distress  HEENT: normal  Neck: no JVD, carotid bruits, or masses Cardiac: RRR; no murmurs, rubs, or gallops,no edema  Respiratory:  clear to auscultation bilaterally, normal work of breathing GI: soft, nontender, nondistended, + BS MS: no deformity or atrophy  Skin: warm and dry, no rash Neuro:  Alert and Oriented x 3, Strength and sensation are intact Psych: euthymic mood, full affect  Wt Readings from Last 3 Encounters:  12/23/17 213 lb (96.6 kg)  12/12/15 205 lb 14.6 oz (93.4 kg)  12/01/15 205 lb 14.6 oz (93.4 kg)      Studies/Labs Reviewed:   EKG:  EKG is ordered today.  The ekg ordered today demonstrates normal sinus rhythm, minimal T wave inversion in lead I and aVL.  Recent Labs: 01/17/2017: BUN 11; Creatinine, Ser 0.81; Hemoglobin 11.1; Platelets 209; Potassium 3.5; Sodium 142   Lipid Panel    Component Value Date/Time   CHOL 173 04/16/2012 0030   TRIG 200 (H) 04/16/2012 0030   HDL 63 04/16/2012 0030   CHOLHDL 2.7 04/16/2012 0030   VLDL 40 04/16/2012 0030   LDLCALC 70 04/16/2012 0030    Additional studies/ records that were reviewed today include:    Echo 04/16/2012 LV EF: 50% -  55% Study Conclusions  - Left ventricle: The cavity size was normal. Wall thickness was increased in a pattern of mild LVH. Systolic function was normal. The estimated ejection fraction was in the range of 50% to 55%. Wall motion was normal; there were no regional wall motion abnormalities. Doppler parameters are consistent with abnormal left ventricular relaxation (grade 1 diastolic dysfunction). - Mitral valve: Mild  regurgitation. - Pulmonary arteries: Systolic pressure was mildly increased. PA peak pressure: 41mm Hg (S).    Cath 04/17/2012 Hemodynamic Findings: Central aortic pressure: 141/83 Left ventricular pressure: 138/17/31  Angiographic Findings:  Left main: No obstructive disease.  Left Anterior Descending Artery: Large caliber vessel that courses to the apex. There are mild luminal irregularities but no focal stenosis.   Circumflex Artery: Moderate sized vessel that gives off three small to moderate sized marginal branches that are free of disease. There is a small caliber intermediate branch that appears to have 50% stenosis at the ostium. (This vessel is 1.5 mm.)   Right Coronary Artery: Small to moderate sized dominant vessel with mild luminal irregularities but no focal stenosis.   Left Ventricular Angiogram: LVEF=50%.  Impression: 1. Mild, non-obstructive CAD 2. Low normal LV systolic function 3. Non-cardiac chest pain    Myoview 11/09/2015 Study Highlights    The left ventricular ejection fraction is normal (55-65%).  Nuclear stress EF: 60%.  There was no ST segment deviation noted during stress.  The study is normal.  This is a low risk study.  Normal myocardial perfusion.   Normal myocardial perfusion and function; EF 60%.       ASSESSMENT:    1. Preop cardiovascular exam   2. Chest pain, unspecified type   3. Essential hypertension   4. Hyperlipidemia, unspecified hyperlipidemia type   5. NICM (nonischemic cardiomyopathy) (HCC)   6. Hypothyroidism, unspecified type      PLAN:  In order of problems listed above:  1. Preoperative clearance: Upcoming left knee surgery by Dr. [name].  Patient has chronic chest pain dating back to the past 7 years.  The degree and frequency of the chest pain has not changed.  Last episode was more than a month ago.  Last Myoview in January 2017 was normal.  I discussed  the case with [name], I would  not recommend any further workup in this case.  Patient is cleared to proceed with surgery.  2. Hypertension: Blood pressure very well controlled on current medication  3. Hyperlipidemia: On Zocor 20 mg daily.  Fasting lipid panel per primary care provider.  4. History of cardiomyopathy: However last Myoview in 2017 showed normal ejection fraction  5. Hypothyroidism: Managed by primary care provider    Medication Adjustments/Labs and Tests Ordered: Current medicines are reviewed at length with the patient today.  Concerns regarding medicines are outlined above.  Medication changes, Labs and Tests ordered today are listed in the Patient Instructions below. Patient Instructions  Medication Instructions:  Your physician recommends that you continue on your current medications as directed. Please refer to the Current Medication list given to you today.  Labwork: None   Testing/Procedures: None   Follow-Up: Your physician wants you to follow-up in: 6 months with Dr Swaziland. You will receive a reminder letter in the mail two months in advance. If you don't receive a letter, please call our office to schedule the follow-up appointment.  Any Other Special Instructions Will Be Listed Below (If Applicable). If you need a refill on your cardiac medications before your next appointment, please call your pharmacy.     Ramond Dial, [place]  12/23/2017 2:05 PM    [facility] [address] [place], [place], [place]  [address] Phone: [phone]; Fax: [phone]

## 2017-12-25 NOTE — Pre-Procedure Instructions (Signed)
MARGORY CANOY  12/25/2017      [id number] - [place],  - [address] AT [facility] [address] [place] [address] Phone: [phone] Fax: [phone]    Your procedure is scheduled on January 06, 2018.  Report to [facility] Admitting at 530 AM.  Call this number if you have problems the morning of surgery:  [phone]   Remember:  Do not eat food or drink liquids after midnight.  Take these medicines the morning of surgery with A SIP OF WATER carvedilol (coreg), diazepam (valium)-if needed, levothyroxine (synthroid), morphine-if needed, ondansetron (zofran)-if needed for nausea, oxycodone (roxicodone) or tramadol (ultram), tizanidine (zanaflex),   7 days prior to surgery STOP taking any Aspirin (unless otherwise instructed by your surgeon), Aleve, Naproxen, Ibuprofen, Motrin, Advil, Goody's, BC's, all herbal medications, fish oil, and all vitamins  Continue all other medications as instructed by your physician except follow the above medication instructions before surgery   Do not wear jewelry, make-up or nail polish.  Do not wear lotions, powders, or perfumes, or deodorant.  Do not shave 48 hours prior to surgery.    Do not bring valuables to the hospital.  [facility] is not responsible for any belongings or valuables.  Contacts, dentures or bridgework may not be worn into surgery.  Leave your suitcase in the car.  After surgery it may be brought to your room.  For patients admitted to the hospital, discharge time will be determined by your treatment team.  Patients discharged the day of surgery will not be allowed to drive home.   [facility]- Preparing For Surgery  Before surgery, you can play an important role. Because skin is not sterile, your skin needs to be as free of germs as possible. You can reduce the number of germs on your skin by washing with CHG (chlorahexidine gluconate) Soap before  surgery.  CHG is an antiseptic cleaner which kills germs and bonds with the skin to continue killing germs even after washing.  Please do not use if you have an allergy to CHG or antibacterial soaps. If your skin becomes reddened/irritated stop using the CHG.  Do not shave (including legs and underarms) for at least 48 hours prior to first CHG shower. It is OK to shave your face.  Please follow these instructions carefully.   1. Shower the NIGHT BEFORE SURGERY and the MORNING OF SURGERY with CHG.   2. If you chose to wash your hair, wash your hair first as usual with your normal shampoo.  3. After you shampoo, rinse your hair and body thoroughly to remove the shampoo.  4. Use CHG as you would any other liquid soap. You can apply CHG directly to the skin and wash gently with a scrungie or a clean washcloth.   5. Apply the CHG Soap to your body ONLY FROM THE NECK DOWN.  Do not use on open wounds or open sores. Avoid contact with your eyes, ears, mouth and genitals (private parts). Wash Face and genitals (private parts)  with your normal soap.  6. Wash thoroughly, paying special attention to the area where your surgery will be performed.  7. Thoroughly rinse your body with warm water from the neck down.  8. DO NOT shower/wash with your normal soap after using and rinsing off the CHG Soap.  9. Pat yourself dry with a CLEAN TOWEL.  10. Wear CLEAN PAJAMAS to bed the night before surgery,  wear comfortable clothes the morning of surgery  11. Place CLEAN SHEETS on your bed the night of your first shower and DO NOT SLEEP WITH PETS.  Day of Surgery: Do not apply any deodorants/lotions. Please wear clean clothes to the hospital/surgery center.    Please read over the following fact sheets that you were given. Pain Booklet, Coughing and Deep Breathing, MRSA Information and Surgical Site Infection Prevention

## 2017-12-25 NOTE — Progress Notes (Addendum)
PCP: Burton Apley, MD  Cardiologist: [name], MD  EKG: 12/23/17 in [facility]  Stress test: 11/09/15 in EP[facility]  ECHO: 04/16/12 and  11/28/08 in EPIC  Cardiac Cath: 04/17/12 in EP[facility]  Chest x-ray:01/17/17 in [facility]

## 2017-12-26 ENCOUNTER — Other Ambulatory Visit: Payer: Self-pay

## 2017-12-26 ENCOUNTER — Encounter (HOSPITAL_COMMUNITY)
Admission: RE | Admit: 2017-12-26 | Discharge: 2017-12-26 | Disposition: A | Discharge status: Home / Self Care | Payer: Medicare Other | Source: Ambulatory Visit | Attending: Orthopedic Surgery | Admitting: Orthopedic Surgery

## 2017-12-26 ENCOUNTER — Encounter (HOSPITAL_COMMUNITY): Payer: Self-pay

## 2017-12-26 DIAGNOSIS — F1411 Cocaine abuse, in remission: Secondary | ICD-10-CM | POA: Insufficient documentation

## 2017-12-26 DIAGNOSIS — Z90710 Acquired absence of both cervix and uterus: Secondary | ICD-10-CM | POA: Insufficient documentation

## 2017-12-26 DIAGNOSIS — Z9049 Acquired absence of other specified parts of digestive tract: Secondary | ICD-10-CM | POA: Insufficient documentation

## 2017-12-26 DIAGNOSIS — Z01812 Encounter for preprocedural laboratory examination: Secondary | ICD-10-CM | POA: Diagnosis present

## 2017-12-26 DIAGNOSIS — E039 Hypothyroidism, unspecified: Secondary | ICD-10-CM | POA: Diagnosis not present

## 2017-12-26 DIAGNOSIS — Z9889 Other specified postprocedural states: Secondary | ICD-10-CM | POA: Insufficient documentation

## 2017-12-26 DIAGNOSIS — Z87891 Personal history of nicotine dependence: Secondary | ICD-10-CM | POA: Insufficient documentation

## 2017-12-26 DIAGNOSIS — M1990 Unspecified osteoarthritis, unspecified site: Secondary | ICD-10-CM | POA: Insufficient documentation

## 2017-12-26 DIAGNOSIS — M1712 Unilateral primary osteoarthritis, left knee: Secondary | ICD-10-CM | POA: Diagnosis not present

## 2017-12-26 DIAGNOSIS — I10 Essential (primary) hypertension: Secondary | ICD-10-CM | POA: Insufficient documentation

## 2017-12-26 DIAGNOSIS — E7800 Pure hypercholesterolemia, unspecified: Secondary | ICD-10-CM | POA: Diagnosis not present

## 2017-12-26 DIAGNOSIS — Z7982 Long term (current) use of aspirin: Secondary | ICD-10-CM | POA: Insufficient documentation

## 2017-12-26 DIAGNOSIS — I739 Peripheral vascular disease, unspecified: Secondary | ICD-10-CM | POA: Diagnosis not present

## 2017-12-26 DIAGNOSIS — M797 Fibromyalgia: Secondary | ICD-10-CM | POA: Diagnosis not present

## 2017-12-26 DIAGNOSIS — Z79891 Long term (current) use of opiate analgesic: Secondary | ICD-10-CM | POA: Diagnosis not present

## 2017-12-26 DIAGNOSIS — R079 Chest pain, unspecified: Secondary | ICD-10-CM | POA: Insufficient documentation

## 2017-12-26 DIAGNOSIS — I2510 Atherosclerotic heart disease of native coronary artery without angina pectoris: Secondary | ICD-10-CM | POA: Insufficient documentation

## 2017-12-26 DIAGNOSIS — K5790 Diverticulosis of intestine, part unspecified, without perforation or abscess without bleeding: Secondary | ICD-10-CM | POA: Insufficient documentation

## 2017-12-26 DIAGNOSIS — Z79899 Other long term (current) drug therapy: Secondary | ICD-10-CM | POA: Diagnosis not present

## 2017-12-26 DIAGNOSIS — F329 Major depressive disorder, single episode, unspecified: Secondary | ICD-10-CM | POA: Diagnosis not present

## 2017-12-26 DIAGNOSIS — R0609 Other forms of dyspnea: Secondary | ICD-10-CM | POA: Insufficient documentation

## 2017-12-26 DIAGNOSIS — I252 Old myocardial infarction: Secondary | ICD-10-CM | POA: Insufficient documentation

## 2017-12-26 HISTORY — DX: Acute myocardial infarction, unspecified: I21.9

## 2017-12-26 LAB — CBC
HCT: 35.9 % — ABNORMAL LOW (ref 36.0–46.0)
Hemoglobin: 11.1 g/dL — ABNORMAL LOW (ref 12.0–15.0)
MCH: 30.3 pg (ref 26.0–34.0)
MCHC: 30.9 g/dL (ref 30.0–36.0)
MCV: 98.1 fL (ref 78.0–100.0)
PLATELETS: 208 10*3/uL (ref 150–400)
RBC: 3.66 MIL/uL — ABNORMAL LOW (ref 3.87–5.11)
RDW: 12.7 % (ref 11.5–15.5)
WBC: 3 10*3/uL — ABNORMAL LOW (ref 4.0–10.5)

## 2017-12-26 LAB — BASIC METABOLIC PANEL
Anion gap: 8 (ref 5–15)
BUN: 13 mg/dL (ref 6–20)
CALCIUM: 8.9 mg/dL (ref 8.9–10.3)
CO2: 28 mmol/L (ref 22–32)
CREATININE: 0.96 mg/dL (ref 0.44–1.00)
Chloride: 103 mmol/L (ref 101–111)
GFR calc Af Amer: >60 mL/min (ref >60)
GFR calc non Af Amer: 60 mL/min — ABNORMAL LOW (ref >60)
GLUCOSE: 117 mg/dL — AB (ref 65–99)
Potassium: 4.2 mmol/L (ref 3.5–5.1)
Sodium: 139 mmol/L (ref 135–145)

## 2017-12-26 LAB — HEMOGLOBIN A1C
HEMOGLOBIN A1C: 5.4 % (ref 4.8–5.6)
Mean Plasma Glucose: 108.28 mg/dL

## 2017-12-26 LAB — SURGICAL PCR SCREEN
MRSA, PCR: NEGATIVE
STAPHYLOCOCCUS AUREUS: NEGATIVE

## 2017-12-26 MED ORDER — CHLORHEXIDINE GLUCONATE 4 % EX LIQD: 60.0000 mL | Freq: Once | CUTANEOUS | Status: DC

## 2017-12-29 ENCOUNTER — Encounter (HOSPITAL_COMMUNITY): Payer: Self-pay

## 2017-12-29 NOTE — Progress Notes (Addendum)
Anesthesia chart review: Patient is a 67 year old female scheduled for left unicompartmental knee arthroplasty, possible total knee arthroplasty on 01/06/18 by Dr. Margarita Rana.  History includes former smoker (quit '00), chronic chest pain, MI-->nonischemic cardiomyopathy '02, mild non-obstructive CAD by 2013 cath, hypertension, hypercholesterolemia, depression (with hospitalization for suicidal ideation 2001), prior polysubstance abuse (cocaine ~ 2000; denied current), diverticulitis, exertional dyspnea, PVD, arthritis, fibromyalgia, hypothyroidism, tummy tuck '09, back surgery, hysterectomy, right unicompartmental knee arthroplasty 12/12/15 cholecystectomy 03/05/01. BMI is consistent with obesity.   - PCP is Dr. Burton Apley. Patient was seen earlier this month. He is aware that surgery is planned 01/06/18. - Cardiologist is Dr. Swaziland, last visit with Azalee Course, PA-C on 12/23/17. who cleared patient for surgery following recent low risk stress test. No further cardiac testing recommended prior to knee surgery (he also reviewed with [name]).  Meds include aspirin 81 mg (per PAT, instructed to hold unless otherwise instructed by surgeon), Coreg, Valium, levothyroxine, MSIR, Nitro, Zofran, oxycodone, Lyrica, ramipril, Zocor, Zanaflex, trazodone.  BP 120/70   Pulse 63   Temp 36.8 C   Resp 20   Ht 5\' 2"  (1.575 m)   Wt 213 lb (96.6 kg)   SpO2 95%   BMI 38.96 kg/m   EKG 12/23/17: SR, minimal T wave inversion in high lateral leads and T wave flattening leads V3-6. Non-specific ST/T wave changes are new or more pronounced when compared to previous tracing.   Nuclear stress test 11/09/15:   The left ventricular ejection fraction is normal (55-65%).  Nuclear stress EF: 60%.  There was no ST segment deviation noted during stress.  The study is normal.  This is a low risk study.  Normal myocardial perfusion. Normal myocardial perfusion and function; EF 60%.  Echo 04/16/12:  Study Conclusions - Left ventricle: The cavity size was normal. Wall thickness was increased in a pattern of mild LVH. Systolic function was normal. The estimated ejection fraction was in the range of 50% to 55%. Wall motion was normal; there were no regional wall motion abnormalities. Doppler parameters are consistent with abnormal left ventricular relaxation (grade 1 diastolic dysfunction). - Mitral valve: Mild regurgitation. - Pulmonary arteries: Systolic pressure was mildly increased. PA peak pressure: 40mm Hg (S).  Cardiac cath 04/17/12: Left main: No obstructive disease. Left Anterior Descending Artery: Large caliber vessel that courses to the apex. There are mild luminal irregularities but no focal stenosis.  Circumflex Artery: Moderate sized vessel that gives off three small to moderate sized marginal branches that are free of disease. There is a small caliber intermediate branch that appears to have 50% stenosis at the ostium. (This vessel is 1.5 mm.)  Right Coronary Artery: Small to moderate sized dominant vessel with mild luminal irregularities but no focal stenosis.  Left Ventricular Angiogram: LVEF=50%. Impression: 1. Mild, non-obstructive CAD 2. Low normal LV systolic function 3. Non-cardiac chest pain Recommendations: Would manage her non-obstructive CAD with ASA 81 mg po QDaily, statin.  CXR 01/17/17: IMPRESSION: No active cardiopulmonary disease.  Preoperative labs noted. A1c 5.4. Cr 0.96.  WBC 3.0 (previously 4.1 01/17/17, 2.9 12/01/15. Per my 11/2015 note, Dr. Su Hilt had comparison labs then reflecting WBC 2.6-3.6 (2016 lab copies on chart). His last office note and labs requested, but no labs received because last labs there were done in 2017.  Based on currently available information, I would anticipate that she can proceed as planned as she has been cleared by cardiology and WBC count appears stable since 2016.  Shonna Chock,  PA-C [facility]  Center/Anesthesiology Phone [phone] 12/29/2017 3:56 PM

## 2018-01-05 MED ORDER — TRANEXAMIC ACID 1000 MG/10ML IV SOLN
1000.0000 mg | INTRAVENOUS | Status: AC
  Administered 2018-01-06: 1000 mg via INTRAVENOUS
  Filled 2018-01-05: qty 1100

## 2018-01-05 NOTE — Anesthesia Preprocedure Evaluation (Addendum)
Anesthesia Evaluation  Patient identified by MRN, date of birth, ID band Patient awake    Reviewed: Allergy & Precautions, NPO status , Patient's Chart, lab work & pertinent test results, reviewed documented beta blocker date and time   Airway Mallampati: II  TM Distance: >3 FB Neck ROM: Full    Dental  (+) Teeth Intact, Dental Advisory Given, Partial Upper, Partial Lower   Pulmonary shortness of breath and with exertion, former smoker,    Pulmonary exam normal breath sounds clear to auscultation       Cardiovascular hypertension, Pt. on medications and Pt. on home beta blockers + angina + Peripheral Vascular Disease  Normal cardiovascular exam Rhythm:Regular Rate:Normal   EKG 12/23/17: SR, minimal T wave inversion in high lateral leads and T wave flattening leads V3-6. Non-specific ST/T wave changes are new or more pronounced when compared to previous tracing.   Nuclear stress test 11/09/15:   The left ventricular ejection fraction is normal (55-65%).  Nuclear stress EF: 60%.  There was no ST segment deviation noted during stress.  The study is normal.  This is a low risk study.  Normal myocardial perfusion. Normal myocardial perfusion and function; EF 60%.  Echo 04/16/12: Study Conclusions - Left ventricle: The cavity size was normal. Wall thickness was increased in a pattern of mild LVH. Systolic function was normal. The estimated ejection fraction was in the range of 50% to 55%. Wall motion was normal; there were no regional wall motion abnormalities. Doppler parameters are consistent with abnormal left ventricular relaxation (grade 1 diastolic dysfunction). - Mitral valve: Mild regurgitation. - Pulmonary arteries: Systolic pressure was mildly increased. PA peak pressure: 82mm Hg (S).  Cardiac cath 04/17/12: Left main: No obstructive disease. Left Ventricular Angiogram: LVEF=50%. Impression: 1.  Mild, non-obstructive CAD 2. Low normal LV systolic function 3. Non-cardiac chest pain Recommendations: Would manage her non-obstructive CAD with ASA 81 mg po QDaily, statin.  CXR 01/17/17: IMPRESSION: No active cardiopulmonary disease.     Neuro/Psych PSYCHIATRIC DISORDERS Depression negative neurological ROS     GI/Hepatic negative GI ROS, Neg liver ROS,   Endo/Other  Hypothyroidism Obesity   Renal/GU negative Renal ROS     Musculoskeletal negative musculoskeletal ROS (+) Arthritis , Fibromyalgia -, narcotic dependent  Abdominal   Peds  Hematology  (+) Blood dyscrasia, anemia ,   Anesthesia Other Findings Day of surgery medications reviewed with the patient.  Reproductive/Obstetrics                            Anesthesia Physical  Anesthesia Plan  ASA: III  Anesthesia Plan: General   Post-op Pain Management:    Induction: Intravenous  PONV Risk Score and Plan: 2 and Ondansetron and Treatment may vary due to age or medical condition  Airway Management Planned: LMA and Oral ETT  Additional Equipment:   Intra-op Plan:   Post-operative Plan: Extubation in OR  Informed Consent: I have reviewed the patients History and Physical, chart, labs and discussed the procedure including the risks, benefits and alternatives for the proposed anesthesia with the patient or authorized representative who has indicated his/her understanding and acceptance.   Dental advisory given  Plan Discussed with: CRNA, Anesthesiologist and Surgeon  Anesthesia Plan Comments:       Anesthesia Quick Evaluation

## 2018-01-06 ENCOUNTER — Ambulatory Visit (HOSPITAL_COMMUNITY): Payer: Medicare Other | Admitting: Emergency Medicine

## 2018-01-06 ENCOUNTER — Encounter (HOSPITAL_COMMUNITY)
Admission: RE | Disposition: A | Discharge status: Home / Self Care | Payer: Self-pay | Source: Ambulatory Visit | Attending: Orthopedic Surgery

## 2018-01-06 ENCOUNTER — Observation Stay (HOSPITAL_COMMUNITY): Payer: Medicare Other

## 2018-01-06 ENCOUNTER — Ambulatory Visit (HOSPITAL_COMMUNITY): Payer: Medicare Other | Admitting: Anesthesiology

## 2018-01-06 ENCOUNTER — Encounter (HOSPITAL_COMMUNITY): Payer: Self-pay | Admitting: *Deleted

## 2018-01-06 ENCOUNTER — Observation Stay (HOSPITAL_COMMUNITY)
Admission: RE | Admit: 2018-01-06 | Discharge: 2018-01-08 | Disposition: A | Discharge status: Home / Self Care | Payer: Medicare Other | Source: Ambulatory Visit | Attending: Orthopedic Surgery | Admitting: Orthopedic Surgery

## 2018-01-06 DIAGNOSIS — Z7982 Long term (current) use of aspirin: Secondary | ICD-10-CM | POA: Diagnosis not present

## 2018-01-06 DIAGNOSIS — Z79899 Other long term (current) drug therapy: Secondary | ICD-10-CM | POA: Insufficient documentation

## 2018-01-06 DIAGNOSIS — M25762 Osteophyte, left knee: Secondary | ICD-10-CM | POA: Insufficient documentation

## 2018-01-06 DIAGNOSIS — G8929 Other chronic pain: Secondary | ICD-10-CM | POA: Diagnosis present

## 2018-01-06 DIAGNOSIS — Z87891 Personal history of nicotine dependence: Secondary | ICD-10-CM | POA: Insufficient documentation

## 2018-01-06 DIAGNOSIS — I739 Peripheral vascular disease, unspecified: Secondary | ICD-10-CM | POA: Diagnosis not present

## 2018-01-06 DIAGNOSIS — Z96651 Presence of right artificial knee joint: Secondary | ICD-10-CM | POA: Diagnosis not present

## 2018-01-06 DIAGNOSIS — I10 Essential (primary) hypertension: Secondary | ICD-10-CM | POA: Diagnosis present

## 2018-01-06 DIAGNOSIS — Z96659 Presence of unspecified artificial knee joint: Secondary | ICD-10-CM

## 2018-01-06 DIAGNOSIS — M1710 Unilateral primary osteoarthritis, unspecified knee: Secondary | ICD-10-CM | POA: Diagnosis present

## 2018-01-06 DIAGNOSIS — E039 Hypothyroidism, unspecified: Secondary | ICD-10-CM | POA: Diagnosis not present

## 2018-01-06 DIAGNOSIS — M1712 Unilateral primary osteoarthritis, left knee: Principal | ICD-10-CM | POA: Diagnosis present

## 2018-01-06 DIAGNOSIS — I428 Other cardiomyopathies: Secondary | ICD-10-CM | POA: Diagnosis not present

## 2018-01-06 DIAGNOSIS — I2510 Atherosclerotic heart disease of native coronary artery without angina pectoris: Secondary | ICD-10-CM | POA: Insufficient documentation

## 2018-01-06 DIAGNOSIS — E782 Mixed hyperlipidemia: Secondary | ICD-10-CM | POA: Diagnosis not present

## 2018-01-06 DIAGNOSIS — E669 Obesity, unspecified: Secondary | ICD-10-CM | POA: Insufficient documentation

## 2018-01-06 DIAGNOSIS — Z79891 Long term (current) use of opiate analgesic: Secondary | ICD-10-CM | POA: Insufficient documentation

## 2018-01-06 DIAGNOSIS — M797 Fibromyalgia: Secondary | ICD-10-CM | POA: Diagnosis not present

## 2018-01-06 DIAGNOSIS — Z6838 Body mass index (BMI) 38.0-38.9, adult: Secondary | ICD-10-CM | POA: Insufficient documentation

## 2018-01-06 HISTORY — PX: PARTIAL KNEE ARTHROPLASTY: SHX2174

## 2018-01-06 SURGERY — ARTHROPLASTY, KNEE, UNICOMPARTMENTAL
Anesthesia: Spinal | Site: Knee | Laterality: Left

## 2018-01-06 MED ORDER — MAGNESIUM CITRATE PO SOLN: 1.0000 | Freq: Once | ORAL | Status: DC | PRN

## 2018-01-06 MED ORDER — DOCUSATE SODIUM 100 MG PO CAPS
100.0000 mg | ORAL_CAPSULE | Freq: Two times a day (BID) | ORAL | Status: DC
  Administered 2018-01-07 – 2018-01-08 (×3): 100 mg via ORAL
  Filled 2018-01-06 (×4): qty 1

## 2018-01-06 MED ORDER — ONDANSETRON HCL 4 MG PO TABS: 4.0000 mg | ORAL_TABLET | Freq: Four times a day (QID) | ORAL | Status: DC | PRN

## 2018-01-06 MED ORDER — BUPIVACAINE LIPOSOME 1.3 % IJ SUSP
20.0000 mL | Freq: Once | INTRAMUSCULAR | Status: DC
  Filled 2018-01-06: qty 20

## 2018-01-06 MED ORDER — SIMVASTATIN 20 MG PO TABS
20.0000 mg | ORAL_TABLET | Freq: Every day | ORAL | Status: DC
  Administered 2018-01-06 – 2018-01-07 (×2): 20 mg via ORAL
  Filled 2018-01-06 (×2): qty 1

## 2018-01-06 MED ORDER — BUPIVACAINE HCL (PF) 0.25 % IJ SOLN
INTRAMUSCULAR | Status: AC
  Filled 2018-01-06: qty 30

## 2018-01-06 MED ORDER — LABETALOL HCL 5 MG/ML IV SOLN
INTRAVENOUS | Status: AC
  Filled 2018-01-06: qty 4

## 2018-01-06 MED ORDER — BUPIVACAINE HCL (PF) 0.25 % IJ SOLN
INTRAMUSCULAR | Status: AC
Start: 2018-01-06 — End: 2018-01-06
  Filled 2018-01-06: qty 30

## 2018-01-06 MED ORDER — GABAPENTIN 300 MG PO CAPS
300.0000 mg | ORAL_CAPSULE | Freq: Once | ORAL | Status: AC
  Administered 2018-01-06: 300 mg via ORAL
  Filled 2018-01-06: qty 1

## 2018-01-06 MED ORDER — DEXAMETHASONE SODIUM PHOSPHATE 10 MG/ML IJ SOLN
10.0000 mg | Freq: Once | INTRAMUSCULAR | Status: AC
  Administered 2018-01-07: 10 mg via INTRAVENOUS
  Filled 2018-01-06: qty 1

## 2018-01-06 MED ORDER — MIDAZOLAM HCL 5 MG/5ML IJ SOLN
INTRAMUSCULAR | Status: DC | PRN
  Administered 2018-01-06: 2 mg via INTRAVENOUS

## 2018-01-06 MED ORDER — ASPIRIN EC 325 MG PO TBEC: 325.0000 mg | DELAYED_RELEASE_TABLET | Freq: Every day | ORAL | 0 refills | Status: DC

## 2018-01-06 MED ORDER — TIZANIDINE HCL 4 MG PO TABS
4.0000 mg | ORAL_TABLET | Freq: Four times a day (QID) | ORAL | Status: DC | PRN
  Administered 2018-01-06 – 2018-01-08 (×4): 4 mg via ORAL
  Filled 2018-01-06 (×4): qty 1

## 2018-01-06 MED ORDER — NALOXONE HCL 0.4 MG/ML IJ SOLN: 0.4000 mg | INTRAMUSCULAR | Status: DC | PRN

## 2018-01-06 MED ORDER — KETOROLAC TROMETHAMINE 30 MG/ML IJ SOLN
INTRAMUSCULAR | Status: AC
  Administered 2018-01-06: 30 mg via INTRAVENOUS
  Filled 2018-01-06: qty 1

## 2018-01-06 MED ORDER — MEPERIDINE HCL 50 MG/ML IJ SOLN: 6.2500 mg | INTRAMUSCULAR | Status: DC | PRN

## 2018-01-06 MED ORDER — LACTATED RINGERS IV SOLN
INTRAVENOUS | Status: DC
  Administered 2018-01-06: 15:00:00 via INTRAVENOUS

## 2018-01-06 MED ORDER — ONDANSETRON HCL 4 MG/2ML IJ SOLN: 4.0000 mg | Freq: Four times a day (QID) | INTRAMUSCULAR | Status: DC | PRN

## 2018-01-06 MED ORDER — ONDANSETRON HCL 4 MG/2ML IJ SOLN: 4.0000 mg | Freq: Once | INTRAMUSCULAR | Status: DC | PRN

## 2018-01-06 MED ORDER — PROPOFOL 10 MG/ML IV BOLUS
INTRAVENOUS | Status: AC
  Filled 2018-01-06: qty 20

## 2018-01-06 MED ORDER — DEXAMETHASONE SODIUM PHOSPHATE 10 MG/ML IJ SOLN
INTRAMUSCULAR | Status: DC | PRN
  Administered 2018-01-06: 10 mg via INTRAVENOUS

## 2018-01-06 MED ORDER — OXYCODONE HCL 5 MG PO TABS
ORAL_TABLET | ORAL | Status: AC
  Administered 2018-01-06: 30 mg via ORAL
  Filled 2018-01-06: qty 1

## 2018-01-06 MED ORDER — PHENOL 1.4 % MT LIQD: 1.0000 | OROMUCOSAL | Status: DC | PRN

## 2018-01-06 MED ORDER — NITROGLYCERIN 0.4 MG SL SUBL: 0.4000 mg | SUBLINGUAL_TABLET | SUBLINGUAL | Status: DC | PRN

## 2018-01-06 MED ORDER — CEFAZOLIN SODIUM-DEXTROSE 2-4 GM/100ML-% IV SOLN
2.0000 g | INTRAVENOUS | Status: AC
  Administered 2018-01-06: 2 g via INTRAVENOUS
  Filled 2018-01-06: qty 100

## 2018-01-06 MED ORDER — OXYCODONE HCL 5 MG PO TABS
5.0000 mg | ORAL_TABLET | Freq: Once | ORAL | Status: AC | PRN
  Administered 2018-01-06: 5 mg via ORAL

## 2018-01-06 MED ORDER — MENTHOL 3 MG MT LOZG: 1.0000 | LOZENGE | OROMUCOSAL | Status: DC | PRN

## 2018-01-06 MED ORDER — OXYCODONE HCL 5 MG/5ML PO SOLN: 5.0000 mg | Freq: Once | ORAL | Status: AC | PRN

## 2018-01-06 MED ORDER — BUPIVACAINE HCL (PF) 0.25 % IJ SOLN
INTRAMUSCULAR | Status: DC | PRN
  Administered 2018-01-06: 30 mL

## 2018-01-06 MED ORDER — ONDANSETRON HCL 4 MG/2ML IJ SOLN
INTRAMUSCULAR | Status: DC | PRN
  Administered 2018-01-06: 4 mg via INTRAVENOUS

## 2018-01-06 MED ORDER — POLYETHYLENE GLYCOL 3350 17 GM/SCOOP PO POWD
17.0000 g | Freq: Every day | ORAL | Status: DC
  Filled 2018-01-06: qty 255

## 2018-01-06 MED ORDER — DIPHENHYDRAMINE HCL 12.5 MG/5ML PO ELIX: 12.5000 mg | ORAL_SOLUTION | ORAL | Status: DC | PRN

## 2018-01-06 MED ORDER — FENTANYL CITRATE (PF) 100 MCG/2ML IJ SOLN
INTRAMUSCULAR | Status: AC
  Administered 2018-01-06: 50 ug via INTRAVENOUS
  Filled 2018-01-06: qty 2

## 2018-01-06 MED ORDER — PANTOPRAZOLE SODIUM 40 MG PO TBEC
40.0000 mg | DELAYED_RELEASE_TABLET | Freq: Every day | ORAL | Status: DC
  Administered 2018-01-07 – 2018-01-08 (×2): 40 mg via ORAL
  Filled 2018-01-06 (×2): qty 1

## 2018-01-06 MED ORDER — BUPIVACAINE HCL (PF) 0.5 % IJ SOLN
INTRAMUSCULAR | Status: AC
  Filled 2018-01-06: qty 30

## 2018-01-06 MED ORDER — LABETALOL HCL 5 MG/ML IV SOLN
INTRAVENOUS | Status: DC | PRN
  Administered 2018-01-06: 5 mg via INTRAVENOUS

## 2018-01-06 MED ORDER — METOCLOPRAMIDE HCL 5 MG/ML IJ SOLN: 5.0000 mg | Freq: Three times a day (TID) | INTRAMUSCULAR | Status: DC | PRN

## 2018-01-06 MED ORDER — ACETAMINOPHEN 500 MG PO TABS
1000.0000 mg | ORAL_TABLET | Freq: Three times a day (TID) | ORAL | Status: DC
  Administered 2018-01-06 – 2018-01-07 (×4): 1000 mg via ORAL
  Filled 2018-01-06 (×4): qty 2

## 2018-01-06 MED ORDER — CEFAZOLIN SODIUM-DEXTROSE 2-4 GM/100ML-% IV SOLN
2.0000 g | Freq: Four times a day (QID) | INTRAVENOUS | Status: AC
  Administered 2018-01-06 (×2): 2 g via INTRAVENOUS
  Filled 2018-01-06 (×2): qty 100

## 2018-01-06 MED ORDER — SORBITOL 70 % SOLN: 30.0000 mL | Freq: Every day | Status: DC | PRN

## 2018-01-06 MED ORDER — OMEPRAZOLE 20 MG PO CPDR: 20.0000 mg | DELAYED_RELEASE_CAPSULE | Freq: Every day | ORAL | 0 refills | Status: DC

## 2018-01-06 MED ORDER — FENTANYL CITRATE (PF) 100 MCG/2ML IJ SOLN
INTRAMUSCULAR | Status: AC
  Filled 2018-01-06: qty 2

## 2018-01-06 MED ORDER — DIAZEPAM 5 MG PO TABS
5.0000 mg | ORAL_TABLET | Freq: Two times a day (BID) | ORAL | Status: DC
  Administered 2018-01-06 – 2018-01-08 (×4): 5 mg via ORAL
  Filled 2018-01-06 (×4): qty 1

## 2018-01-06 MED ORDER — BUPIVACAINE HCL (PF) 0.75 % IJ SOLN
INTRAMUSCULAR | Status: DC | PRN
  Administered 2018-01-06: 20 mL

## 2018-01-06 MED ORDER — FENTANYL CITRATE (PF) 100 MCG/2ML IJ SOLN
25.0000 ug | INTRAMUSCULAR | Status: DC | PRN
  Administered 2018-01-06 (×3): 50 ug via INTRAVENOUS

## 2018-01-06 MED ORDER — KETOROLAC TROMETHAMINE 15 MG/ML IJ SOLN
7.5000 mg | Freq: Three times a day (TID) | INTRAMUSCULAR | Status: AC
  Administered 2018-01-06 – 2018-01-08 (×6): 7.5 mg via INTRAVENOUS
  Filled 2018-01-06 (×6): qty 1

## 2018-01-06 MED ORDER — LACTATED RINGERS IV SOLN
INTRAVENOUS | Status: DC
  Administered 2018-01-06 (×2): via INTRAVENOUS

## 2018-01-06 MED ORDER — DOCUSATE SODIUM 100 MG PO CAPS: 100.0000 mg | ORAL_CAPSULE | Freq: Two times a day (BID) | ORAL | 0 refills | Status: DC

## 2018-01-06 MED ORDER — PROPOFOL 10 MG/ML IV BOLUS
INTRAVENOUS | Status: DC | PRN
  Administered 2018-01-06: 200 mg via INTRAVENOUS

## 2018-01-06 MED ORDER — ACETAMINOPHEN 160 MG/5ML PO SOLN: 325.0000 mg | ORAL | Status: DC | PRN

## 2018-01-06 MED ORDER — METOCLOPRAMIDE HCL 5 MG PO TABS: 5.0000 mg | ORAL_TABLET | Freq: Three times a day (TID) | ORAL | Status: DC | PRN

## 2018-01-06 MED ORDER — TRAZODONE HCL 50 MG PO TABS
50.0000 mg | ORAL_TABLET | Freq: Every day | ORAL | Status: DC
Start: 2018-01-06 — End: 2018-01-08
  Administered 2018-01-06 – 2018-01-07 (×2): 50 mg via ORAL
  Filled 2018-01-06 (×2): qty 1

## 2018-01-06 MED ORDER — POVIDONE-IODINE 10 % EX SWAB
2.0000 | Freq: Once | CUTANEOUS | Status: AC
Start: 2018-01-06 — End: 2018-01-06
  Administered 2018-01-06: 2 via TOPICAL

## 2018-01-06 MED ORDER — LABETALOL HCL 5 MG/ML IV SOLN
10.0000 mg | INTRAVENOUS | Status: DC | PRN
  Filled 2018-01-06: qty 4

## 2018-01-06 MED ORDER — PREGABALIN 75 MG PO CAPS
150.0000 mg | ORAL_CAPSULE | Freq: Every day | ORAL | Status: DC
  Administered 2018-01-06 – 2018-01-07 (×2): 150 mg via ORAL
  Filled 2018-01-06 (×2): qty 2

## 2018-01-06 MED ORDER — ACETAMINOPHEN 500 MG PO TABS
1000.0000 mg | ORAL_TABLET | Freq: Once | ORAL | Status: AC
  Administered 2018-01-06: 1000 mg via ORAL
  Filled 2018-01-06: qty 2

## 2018-01-06 MED ORDER — ACETAMINOPHEN 325 MG PO TABS: 325.0000 mg | ORAL_TABLET | Freq: Four times a day (QID) | ORAL | Status: DC | PRN

## 2018-01-06 MED ORDER — KETOROLAC TROMETHAMINE 30 MG/ML IJ SOLN
30.0000 mg | Freq: Once | INTRAMUSCULAR | Status: DC | PRN
  Administered 2018-01-06: 30 mg via INTRAVENOUS

## 2018-01-06 MED ORDER — OXYCODONE HCL 5 MG PO TABS
30.0000 mg | ORAL_TABLET | Freq: Three times a day (TID) | ORAL | Status: DC
  Administered 2018-01-06 – 2018-01-08 (×5): 30 mg via ORAL
  Filled 2018-01-06 (×6): qty 6

## 2018-01-06 MED ORDER — FENTANYL CITRATE (PF) 250 MCG/5ML IJ SOLN
INTRAMUSCULAR | Status: DC | PRN
  Administered 2018-01-06: 50 ug via INTRAVENOUS
  Administered 2018-01-06: 100 ug via INTRAVENOUS
  Administered 2018-01-06 (×2): 50 ug via INTRAVENOUS

## 2018-01-06 MED ORDER — RAMIPRIL 10 MG PO CAPS
10.0000 mg | ORAL_CAPSULE | Freq: Every day | ORAL | Status: DC
  Administered 2018-01-06 – 2018-01-08 (×3): 10 mg via ORAL
  Filled 2018-01-06 (×3): qty 1

## 2018-01-06 MED ORDER — SODIUM CHLORIDE 0.9 % IV SOLN
INTRAVENOUS | Status: DC | PRN
  Administered 2018-01-06: 20 mL

## 2018-01-06 MED ORDER — MIDAZOLAM HCL 2 MG/2ML IJ SOLN
INTRAMUSCULAR | Status: AC
  Filled 2018-01-06: qty 2

## 2018-01-06 MED ORDER — PHENYLEPHRINE HCL 10 MG/ML IJ SOLN
INTRAMUSCULAR | Status: DC | PRN
  Administered 2018-01-06 (×2): 80 ug via INTRAVENOUS
  Administered 2018-01-06: 120 ug via INTRAVENOUS

## 2018-01-06 MED ORDER — ACETAMINOPHEN 325 MG PO TABS: 325.0000 mg | ORAL_TABLET | ORAL | Status: DC | PRN

## 2018-01-06 MED ORDER — CARVEDILOL 3.125 MG PO TABS
3.1250 mg | ORAL_TABLET | Freq: Two times a day (BID) | ORAL | Status: DC
  Administered 2018-01-07 – 2018-01-08 (×3): 3.125 mg via ORAL
  Filled 2018-01-06 (×3): qty 1

## 2018-01-06 MED ORDER — LIDOCAINE HCL (CARDIAC) 20 MG/ML IV SOLN
INTRAVENOUS | Status: DC | PRN
  Administered 2018-01-06: 40 mg via INTRATRACHEAL

## 2018-01-06 MED ORDER — LEVOTHYROXINE SODIUM 75 MCG PO TABS
75.0000 ug | ORAL_TABLET | Freq: Every day | ORAL | Status: DC
  Administered 2018-01-07 – 2018-01-08 (×2): 75 ug via ORAL
  Filled 2018-01-06 (×2): qty 1

## 2018-01-06 MED ORDER — ASPIRIN EC 325 MG PO TBEC
325.0000 mg | DELAYED_RELEASE_TABLET | Freq: Every day | ORAL | Status: DC
  Administered 2018-01-07 – 2018-01-08 (×2): 325 mg via ORAL
  Filled 2018-01-06 (×2): qty 1

## 2018-01-06 MED ORDER — MORPHINE SULFATE 15 MG PO TABS
15.0000 mg | ORAL_TABLET | Freq: Two times a day (BID) | ORAL | Status: DC | PRN
  Administered 2018-01-06 – 2018-01-08 (×4): 15 mg via ORAL
  Filled 2018-01-06 (×5): qty 1

## 2018-01-06 MED ORDER — FENTANYL CITRATE (PF) 250 MCG/5ML IJ SOLN
INTRAMUSCULAR | Status: AC
  Filled 2018-01-06: qty 5

## 2018-01-06 MED ORDER — ACETAMINOPHEN 500 MG PO TABS: 1000.0000 mg | ORAL_TABLET | Freq: Three times a day (TID) | ORAL | 0 refills | Status: AC

## 2018-01-06 MED ORDER — TRAMADOL HCL 50 MG PO TABS
50.0000 mg | ORAL_TABLET | Freq: Four times a day (QID) | ORAL | Status: DC | PRN
  Administered 2018-01-06 – 2018-01-07 (×2): 50 mg via ORAL
  Filled 2018-01-06 (×3): qty 1

## 2018-01-06 SURGICAL SUPPLY — 56 items
BANDAGE ACE 6X5 VEL STRL LF (GAUZE/BANDAGES/DRESSINGS) ×2 IMPLANT
BANDAGE ESMARK 6X9 LF (GAUZE/BANDAGES/DRESSINGS) ×1 IMPLANT
BEARING MENISCAL TIBIAL 3 SM L (Orthopedic Implant) ×2 IMPLANT
BNDG CMPR 9X6 STRL LF SNTH (GAUZE/BANDAGES/DRESSINGS) ×1
BNDG ESMARK 6X9 LF (GAUZE/BANDAGES/DRESSINGS) ×3
BOWL SMART MIX CTS (DISPOSABLE) ×3 IMPLANT
BRNG TIB SM 3 PHS 3 LT MEN (Orthopedic Implant) ×1 IMPLANT
CEMENT BONE R 1X40 (Cement) ×2 IMPLANT
CHLORAPREP W/TINT 26ML (MISCELLANEOUS) ×3 IMPLANT
CLOSURE STERI-STRIP 1/2X4 (GAUZE/BANDAGES/DRESSINGS) ×1
CLOSURE WOUND 1/2 X4 (GAUZE/BANDAGES/DRESSINGS) ×1
CLSR STERI-STRIP ANTIMIC 1/2X4 (GAUZE/BANDAGES/DRESSINGS) ×2 IMPLANT
COMPONENT TIB MDL OXFRD LT SZA (Joint) IMPLANT
COVER SURGICAL LIGHT HANDLE (MISCELLANEOUS) ×3 IMPLANT
CUFF TOURNIQUET SINGLE 34IN LL (TOURNIQUET CUFF) ×3 IMPLANT
DRAPE ARTHROSCOPY W/POUCH 114 (DRAPES) ×3 IMPLANT
DRAPE U-SHAPE 47X51 STRL (DRAPES) ×3 IMPLANT
DRSG MEPILEX BORDER 4X4 (GAUZE/BANDAGES/DRESSINGS) IMPLANT
DRSG MEPILEX BORDER 4X8 (GAUZE/BANDAGES/DRESSINGS) ×3 IMPLANT
ELECT CAUTERY BLADE 6.4 (BLADE) ×3 IMPLANT
ELECT REM PT RETURN 9FT ADLT (ELECTROSURGICAL) ×3
ELECTRODE REM PT RTRN 9FT ADLT (ELECTROSURGICAL) ×1 IMPLANT
FACESHIELD WRAPAROUND (MASK) ×6 IMPLANT
FACESHIELD WRAPAROUND OR TEAM (MASK) ×2 IMPLANT
GLOVE BIO SURGEON STRL SZ7.5 (GLOVE) ×6 IMPLANT
GLOVE BIOGEL PI IND STRL 8 (GLOVE) ×2 IMPLANT
GLOVE BIOGEL PI INDICATOR 8 (GLOVE) ×4
GOWN STRL REUS W/ TWL LRG LVL3 (GOWN DISPOSABLE) ×2 IMPLANT
GOWN STRL REUS W/ TWL XL LVL3 (GOWN DISPOSABLE) ×1 IMPLANT
GOWN STRL REUS W/TWL LRG LVL3 (GOWN DISPOSABLE) ×6
GOWN STRL REUS W/TWL XL LVL3 (GOWN DISPOSABLE) ×3
HANDPIECE INTERPULSE COAX TIP (DISPOSABLE) ×3
IMMOBILIZER KNEE 22 UNIV (SOFTGOODS) ×3 IMPLANT
KIT BASIN OR (CUSTOM PROCEDURE TRAY) ×3 IMPLANT
KIT ROOM TURNOVER OR (KITS) ×3 IMPLANT
MANIFOLD NEPTUNE II (INSTRUMENTS) ×3 IMPLANT
NS IRRIG 1000ML POUR BTL (IV SOLUTION) ×3 IMPLANT
PACK BLADE SAW RECIP 70 3 PT (BLADE) ×2 IMPLANT
PACK TOTAL JOINT (CUSTOM PROCEDURE TRAY) ×3 IMPLANT
PAD ARMBOARD 7.5X6 YLW CONV (MISCELLANEOUS) ×3 IMPLANT
PEG FEMORAL PEGGED STRL SM (Knees) ×2 IMPLANT
SET HNDPC FAN SPRY TIP SCT (DISPOSABLE) ×1 IMPLANT
STAPLER VISISTAT 35W (STAPLE) IMPLANT
STRIP CLOSURE SKIN 1/2X4 (GAUZE/BANDAGES/DRESSINGS) ×1 IMPLANT
SUCTION FRAZIER HANDLE 10FR (MISCELLANEOUS) ×2
SUCTION TUBE FRAZIER 10FR DISP (MISCELLANEOUS) IMPLANT
SUT MNCRL AB 4-0 PS2 18 (SUTURE) ×3 IMPLANT
SUT MON AB 2-0 CT1 27 (SUTURE) ×3 IMPLANT
SUT MON AB 2-0 CT1 36 (SUTURE) ×2 IMPLANT
SUT VIC AB 0 CT1 27 (SUTURE) ×3
SUT VIC AB 0 CT1 27XBRD ANTBC (SUTURE) ×1 IMPLANT
SUT VIC AB 1 CT1 27 (SUTURE) ×12
SUT VIC AB 1 CT1 27XBRD ANBCTR (SUTURE) IMPLANT
SUT VIC AB 1 CT1 27XBRD ANTBC (SUTURE) ×2 IMPLANT
TIBIA MEDIAL OXFORD LEFT SZ A (Joint) ×3 IMPLANT
TOWEL OR 17X26 10 PK STRL BLUE (TOWEL DISPOSABLE) ×3 IMPLANT

## 2018-01-06 NOTE — Transfer of Care (Signed)
Immediate Anesthesia Transfer of Care Note  Patient: Tracy Buckley  Procedure(s) Performed: UNICOMPARTMENTAL KNEE (Left Knee)  Patient Location: PACU  Anesthesia Type:GA combined with regional for post-op pain  Level of Consciousness: awake, alert  and oriented  Airway & Oxygen Therapy: Patient Spontanous Breathing and Patient connected to nasal cannula oxygen  Post-op Assessment: Report given to RN and Post -op Vital signs reviewed and stable  Post vital signs: Reviewed and stable  Last Vitals:  Vitals Value Taken Time  BP 129/93 01/06/2018  9:30 AM  Temp 36.8 C 01/06/2018  9:30 AM  Pulse 77 01/06/2018  9:36 AM  Resp 23 01/06/2018  9:36 AM  SpO2 100 % 01/06/2018  9:36 AM  Vitals shown include unvalidated device data.  Last Pain:  Vitals:   01/06/18 0619  TempSrc:   PainSc: 4       Patients Stated Pain Goal: 3 (01/06/18 0539)  Complications: No apparent anesthesia complications

## 2018-01-06 NOTE — Evaluation (Signed)
Physical Therapy Evaluation Patient Details Name: Tracy Buckley MRN: [id number] DOB: 09/23/1951 Today's Date: 01/06/2018   History of Present Illness  Pt is a 67 y/o female s/p L unicompartmental knee replacement. PMH includes nonischemic cardiomyopathy, PVD, HTN, fibromyalgia, back surgery, and R partial knee replacement.   Clinical Impression  Pt s/p surgery above with deficits below. Pt very limited by pain this session and only tolerated bed mobility and supine LLE exercise. Required mod for bed mobility. Initiated supine HEP and reviewed knee precautions. Anticipate pt will progress well once pain controlled. Will continue to follow acutely to maximize functional mobility independence and safety.     Follow Up Recommendations Follow surgeon's recommendation for DC plan and follow-up therapies;Supervision for mobility/OOB    Equipment Recommendations  Rolling walker with 5" wheels;3in1 (PT)    Recommendations for Other Services       Precautions / Restrictions Precautions Precautions: Knee Precaution Booklet Issued: Yes (comment) Precaution Comments: Reviewed supine HEP and knee precautions.  Restrictions Weight Bearing Restrictions: Yes LLE Weight Bearing: Weight bearing as tolerated      Mobility  Bed Mobility Overal bed mobility: Needs Assistance Bed Mobility: Supine to Sit;Sit to Supine     Supine to sit: Mod assist Sit to supine: Mod assist   General bed mobility comments: Mod A for trunk elevation and for LLE assist. Also required assist with scooting hips to EOB. Upon sitting, pt reporting increased pain and very tearful and reports she felt woozy. Returned to supine with mod A for LE assist.   Transfers                 General transfer comment: Deferred secondary to increased pain and wooziness.   Ambulation/Gait                Stairs            Wheelchair Mobility    Modified Rankin (Stroke Patients Only)       Balance Overall  balance assessment: Needs assistance Sitting-balance support: No upper extremity supported;Feet supported Sitting balance-Leahy Scale: Fair                                       Pertinent Vitals/Pain Pain Assessment: 0-10 Pain Score: 9  Pain Location: L knee  Pain Descriptors / Indicators: Aching;Operative site guarding Pain Intervention(s): Monitored during session;Limited activity within patient's tolerance;Repositioned    Home Living Family/patient expects to be discharged to:: Private residence Living Arrangements: Other relatives(sister ) Available Help at Discharge: Family;Available 24 hours/day Type of Home: House Home Access: Stairs to enter Entrance Stairs-Rails: None Entrance Stairs-Number of Steps: 2 Home Layout: One level Home Equipment: Cane - single point      Prior Function Level of Independence: Independent with assistive device(s)         Comments: USed cane for ambulation      Hand Dominance   Dominant Hand: Right    Extremity/Trunk Assessment   Upper Extremity Assessment Upper Extremity Assessment: Defer to OT evaluation    Lower Extremity Assessment Lower Extremity Assessment: LLE deficits/detail LLE Deficits / Details: Reports slightly decreased sensation. Deficits consistent with post op pain and weakness. Able to perform ther ex below.     Cervical / Trunk Assessment Cervical / Trunk Assessment: Kyphotic  Communication   Communication: No difficulties  Cognition Arousal/Alertness: Awake/alert Behavior During Therapy: WFL for tasks assessed/performed Overall Cognitive Status:  Within Functional Limits for tasks assessed                                        General Comments General comments (skin integrity, edema, etc.): Pt's sister present during session.     Exercises Total Joint Exercises Ankle Circles/Pumps: AROM;Both;20 reps Quad Sets: AROM;Left;10 reps Heel Slides: AAROM;Left;5 reps(limited  by pain )   Assessment/Plan    PT Assessment Patient needs continued PT services  PT Problem List Decreased strength;Decreased range of motion;Decreased activity tolerance;Decreased balance;Decreased mobility;Decreased knowledge of use of DME;Decreased knowledge of precautions;Pain       PT Treatment Interventions DME instruction;Gait training;Stair training;Functional mobility training;Therapeutic activities;Balance training;Therapeutic exercise;Neuromuscular re-education;Patient/family education    PT Goals (Current goals can be found in the Care Plan section)  Acute Rehab PT Goals Patient Stated Goal: to decrease pain  PT Goal Formulation: With patient Time For Goal Achievement: 01/20/18 Potential to Achieve Goals: Good    Frequency 7X/week   Barriers to discharge        Co-evaluation               AM-PAC PT "6 Clicks" Daily Activity  Outcome Measure Difficulty turning over in bed (including adjusting bedclothes, sheets and blankets)?: A Lot Difficulty moving from lying on back to sitting on the side of the bed? : Unable Difficulty sitting down on and standing up from a chair with arms (e.g., wheelchair, bedside commode, etc,.)?: Unable Help needed moving to and from a bed to chair (including a wheelchair)?: A Lot Help needed walking in hospital room?: A Lot Help needed climbing 3-5 steps with a railing? : Total 6 Click Score: 9    End of Session Equipment Utilized During Treatment: Gait belt;Left knee immobilizer Activity Tolerance: Patient limited by pain Patient left: in bed;with call bell/phone within reach;with family/visitor present Nurse Communication: Mobility status;Patient requests pain meds PT Visit Diagnosis: Other abnormalities of gait and mobility (R26.89);Pain Pain - Right/Left: Left Pain - part of body: Knee    Time: 5993-5701 PT Time Calculation (min) (ACUTE ONLY): 36 min   Charges:   PT Evaluation $PT Eval Moderate Complexity: 1 Mod PT  Treatments $Therapeutic Activity: 8-22 mins   PT G Codes:        Gladys Damme, PT, DPT  Acute Rehabilitation Services  Pager: [phone]   Lehman Prom 01/06/2018, 7:02 PM

## 2018-01-06 NOTE — Interval H&P Note (Signed)
History and Physical Interval Note:  01/06/2018 7:10 AM  Tracy Buckley  has presented today for surgery, with the diagnosis of OA LEFT KNEE  The various methods of treatment have been discussed with the patient and family. After consideration of risks, benefits and other options for treatment, the patient has consented to  Procedure(s): UNICOMPARTMENTAL KNEE (Left) as a surgical intervention .  The patient's history has been reviewed, patient examined, no change in status, stable for surgery.  I have reviewed the patient's chart and labs.  Questions were answered to the patient's satisfaction.     Livan Hires D

## 2018-01-06 NOTE — Op Note (Signed)
01/06/2018  8:49 AM  PATIENT:  Tracy Buckley    PRE-OPERATIVE DIAGNOSIS:  OA LEFT KNEE  POST-OPERATIVE DIAGNOSIS:  Same  PROCEDURE:  UNICOMPARTMENTAL KNEE  SURGEON:  Shamal Stracener, Jewel Baize, MD  PHYSICIAN ASSISTANT: Aquilla Hacker, PA-C, he was present and scrubbed throughout the case, critical for completion in a timely fashion, and for retraction, instrumentation, and closure.   ANESTHESIA:   General  PREOPERATIVE INDICATIONS:  Tracy Buckley is a  67 y.o. female with a diagnosis of OA LEFT KNEE who failed conservative measures and elected for surgical management.    The risks benefits and alternatives were discussed with the patient preoperatively including but not limited to the risks of infection, bleeding, nerve injury, cardiopulmonary complications, blood clots, the need for revision surgery, among others, and the patient was willing to proceed.  OPERATIVE IMPLANTS: Biomet Oxford mobile bearing medial compartment arthroplasty. Femoral Component: small. Tibial tray: A, Size 3 poly.   OPERATIVE FINDINGS: Endstage grade 4 medial compartment osteoarthritis. No significant changes in the lateral or patellofemoral joint  OPERATIVE PROCEDURE: The patient was brought to the operating room placed in supine position. General anesthesia was administered. IV antibiotics were given. The lower extremity was placed in the legholder and prepped and draped in usual sterile fashion.  Time out was performed.  The leg was elevated and exsanguinated and the tourniquet was inflated. Anteromedial incision was performed, and I took care to preserve the MCL. Parapatellar incision was carried out, and the osteophytes were excised, along with the medial meniscus and a small portion of the fat pad.  The extra medullary tibial cutting jig was applied, using the spoon and the 70mm G-Clamp, and I took care to protect the anterior cruciate ligament insertion and the tibial spine. The medial collateral ligament  was also protected, and I resected my proximal tibia, matching the anatomic slope.   The proximal tibial bony cut was removed in one piece, and I turned my attention to the femur.  The intramedullary femoral rod was placed using the drill, and then using the appropriate reference, I assembled the femoral jig, setting my posterior cutting block. I resected my posterior femur, and then measured my gap.   I then used the mill to match the extension gap to the flexion gap. The gaps were then measured again with the appropriate feeler gauges. Once I had balanced flexion and extension gaps, I then completed the preparation of the femur.  I milled off the anterior aspect of the distal femur to prevent impingement. I also exposed the tibia, and selected the above-named component, and then used the cutting jig to prepare the keel slot on the tibia. I also used the awl to curette out the bone to complete the preparation of the keel. The back wall was intact.  I then placed trial components, and it was found to have excellent motion, and appropriate balance.  I then cemented the components into place, cementing the tibia first, removing all excess cement, and then cementing the femur.  All loose cement was removed.  The real polyethylene insert was applied manually, and the knee was taken through functional range of motion, and found to have excellent stability and restoration of joint motion, with excellent balance.  The wounds were irrigated copiously, and the parapatellar tissue closed with Vicryl, followed by Vicryl for the subcutaneous tissue, with routine closure with Steri-Strips and sterile gauze.  The tourniquet was released, and the patient was awakened and extubated and returned to PACU in  stable and satisfactory condition. There were no complications.  POSTOPERATIVE PLAN: DVT px will consist of SCD's, mobiliation and chemical px, WBAT     Renette Butters, MD

## 2018-01-06 NOTE — Anesthesia Postprocedure Evaluation (Signed)
Anesthesia Post Note  Patient: Katurah R Carillo  Procedure(s) Performed: UNICOMPARTMENTAL KNEE (Left Knee)     Patient location during evaluation: PACU Anesthesia Type: Spinal Level of consciousness: oriented and awake and alert Pain management: pain level controlled Vital Signs Assessment: post-procedure vital signs reviewed and stable Respiratory status: spontaneous breathing, respiratory function stable and patient connected to nasal cannula oxygen Cardiovascular status: blood pressure returned to baseline and stable Postop Assessment: no headache, no backache and no apparent nausea or vomiting Anesthetic complications: no    Last Vitals:  Vitals:   01/06/18 1056 01/06/18 1100  BP: 129/89 (!) 145/88  Pulse: 81 82  Resp: 17 14  Temp: 36.8 C 36.9 C  SpO2: 92% 100%    Last Pain:  Vitals:   01/06/18 1151  TempSrc:   PainSc: 0-No pain                 Trelyn Vanderlinde

## 2018-01-06 NOTE — Anesthesia Procedure Notes (Signed)
Procedure Name: LMA Insertion Date/Time: 01/06/2018 7:45 AM Performed by: Marena Chancy, CRNA Pre-anesthesia Checklist: Patient identified, Emergency Drugs available, Suction available and Patient being monitored Patient Re-evaluated:Patient Re-evaluated prior to induction Oxygen Delivery Method: Circle System Utilized Preoxygenation: Pre-oxygenation with 100% oxygen Induction Type: IV induction Ventilation: Mask ventilation without difficulty LMA: LMA inserted LMA Size: 4.0 Number of attempts: 1 Airway Equipment and Method: Bite block Placement Confirmation: positive ETCO2 Tube secured with: Tape Dental Injury: Teeth and Oropharynx as per pre-operative assessment

## 2018-01-06 NOTE — Progress Notes (Addendum)
Attempted to remove last earring but was unsuccessful.  It is a screw on back will make anesthesiologist aware of earring  6:49 AM Per dr Tacy Dura, we can just tape over earring

## 2018-01-06 NOTE — Anesthesia Procedure Notes (Signed)
Anesthesia Regional Block: Adductor canal block   Pre-Anesthetic Checklist: ,, timeout performed, Correct Patient, Correct Site, Correct Laterality, Correct Procedure, Correct Position, site marked, Risks and benefits discussed,  Surgical consent,  Pre-op evaluation,  At surgeon's request and post-op pain management  Laterality: Left  Prep: chloraprep       Needles:  Injection technique: Single-shot  Needle Type: Echogenic Stimulator Needle     Needle Length: 5cm  Needle Gauge: 22     Additional Needles:   Procedures:, nerve stimulator,,, ultrasound used (permanent image in chart),,,,  Narrative:  Start time: 01/06/2018 7:25 AM End time: 01/06/2018 7:30 AM Injection made incrementally with aspirations every 5 mL.  Performed by: Personally  Anesthesiologist: Bethena Midget, MD  Additional Notes: Functioning IV was confirmed and monitors were applied.  A 73mm 22ga Arrow echogenic stimulator needle was used. Sterile prep and drape,hand hygiene and sterile gloves were used. Ultrasound guidance: relevant anatomy identified, needle position confirmed, local anesthetic spread visualized around nerve(s)., vascular puncture avoided.  Image printed for medical record. Negative aspiration and negative test dose prior to incremental administration of local anesthetic. The patient tolerated the procedure well.

## 2018-01-06 NOTE — Discharge Instructions (Signed)
You may bear weight as tolerated. Keep your dressing on and dry until follow up. Take medicine to prevent blood clots as directed. Take pain medicine as directed by you pain management physician.  INSTRUCTIONS AFTER JOINT REPLACEMENT   o Remove items at home which could result in a fall. This includes throw rugs or furniture in walking pathways o ICE to the affected joint every three hours while awake for 30 minutes at a time, for at least the first 3-5 days, and then as needed for pain and swelling.  Continue to use ice for pain and swelling. You may notice swelling that will progress down to the foot and ankle.  This is normal after surgery.  Elevate your leg when you are not up walking on it.   o Continue to use the breathing machine you got in the hospital (incentive spirometer) which will help keep your temperature down.  It is common for your temperature to cycle up and down following surgery, especially at night when you are not up moving around and exerting yourself.  The breathing machine keeps your lungs expanded and your temperature down.   DIET:  As you were doing prior to hospitalization, we recommend a well-balanced diet.  DRESSING / WOUND CARE / SHOWERING  You may shower 3 days after surgery, but keep the wounds dry during showering.  You may use an occlusive plastic wrap (Press'n Seal for example) with blue painter's tape at edges, NO SOAKING/SUBMERGING IN THE BATHTUB.  If the bandage gets wet, change with a clean dry gauze.  If the incision gets wet, pat the wound dry with a clean towel.  ACTIVITY  o Increase activity slowly as tolerated, but follow the weight bearing instructions below.   o No driving for 6 weeks or until further direction given by your physician.  You cannot drive while taking narcotics.  o No lifting or carrying greater than 10 lbs. until further directed by your surgeon. o Avoid periods of inactivity such as sitting longer than an hour when not asleep.  This helps prevent blood clots.  o You may return to work once you are authorized by your doctor.     WEIGHT BEARING   Weight bearing as tolerated with assist device (walker, cane, etc) as directed, use it as long as suggested by your surgeon or therapist, typically at least 4-6 weeks.   EXERCISES  Results after joint replacement surgery are often greatly improved when you follow the exercise, range of motion and muscle strengthening exercises prescribed by your doctor. Safety measures are also important to protect the joint from further injury. Any time any of these exercises cause you to have increased pain or swelling, decrease what you are doing until you are comfortable again and then slowly increase them. If you have problems or questions, call your caregiver or physical therapist for advice.   Rehabilitation is important following a joint replacement. After just a few days of immobilization, the muscles of the leg can become weakened and shrink (atrophy).  These exercises are designed to build up the tone and strength of the thigh and leg muscles and to improve motion. Often times heat used for twenty to thirty minutes before working out will loosen up your tissues and help with improving the range of motion but do not use heat for the first two weeks following surgery (sometimes heat can increase post-operative swelling).   These exercises can be done on a training (exercise) mat, on the floor, on a  table or on a bed. Use whatever works the best and is most comfortable for you.    Use music or television while you are exercising so that the exercises are a pleasant break in your day. This will make your life better with the exercises acting as a break in your routine that you can look forward to.   Perform all exercises about fifteen times, three times per day or as directed.  You should exercise both the operative leg and the other leg as well.  Exercises include:    Quad Sets - Tighten  up the muscle on the front of the thigh (Quad) and hold for 5-10 seconds.    Straight Leg Raises - With your knee straight (if you were given a brace, keep it on), lift the leg to 60 degrees, hold for 3 seconds, and slowly lower the leg.  Perform this exercise against resistance later as your leg gets stronger.   Leg Slides: Lying on your back, slowly slide your foot toward your buttocks, bending your knee up off the floor (only go as far as is comfortable). Then slowly slide your foot back down until your leg is flat on the floor again.   Angel Wings: Lying on your back spread your legs to the side as far apart as you can without causing discomfort.   Hamstring Strength:  Lying on your back, push your heel against the floor with your leg straight by tightening up the muscles of your buttocks.  Repeat, but this time bend your knee to a comfortable angle, and push your heel against the floor.  You may put a pillow under the heel to make it more comfortable if necessary.   A rehabilitation program following joint replacement surgery can speed recovery and prevent re-injury in the future due to weakened muscles. Contact your doctor or a physical therapist for more information on knee rehabilitation.    CONSTIPATION  Constipation is defined medically as fewer than three stools per week and severe constipation as less than one stool per week.  Even if you have a regular bowel pattern at home, your normal regimen is likely to be disrupted due to multiple reasons following surgery.  Combination of anesthesia, postoperative narcotics, change in appetite and fluid intake all can affect your bowels.   YOU MUST use at least one of the following options; they are listed in order of increasing strength to get the job done.  They are all available over the counter, and you may need to use some, POSSIBLY even all of these options:    Drink plenty of fluids (prune juice may be helpful) and high fiber  foods Colace 100 mg by mouth twice a day  Senokot for constipation as directed and as needed Dulcolax (bisacodyl), take with full glass of water  Miralax (polyethylene glycol) once or twice a day as needed.  If you have tried all these things and are unable to have a bowel movement in the first 3-4 days after surgery call either your surgeon or your primary doctor.    If you experience loose stools or diarrhea, hold the medications until you stool forms back up.  If your symptoms do not get better within 1 week or if they get worse, check with your doctor.  If you experience "the worst abdominal pain ever" or develop nausea or vomiting, please contact the office immediately for further recommendations for treatment.   ITCHING:  If you experience itching with your medications, try  taking only a single pain pill, or even half a pain pill at a time.  You can also use Benadryl over the counter for itching or also to help with sleep.   TED HOSE STOCKINGS:  Use stockings on both legs until for at least 2 weeks or as directed by physician office. They may be removed at night for sleeping.  MEDICATIONS:  See your medication summary on the After Visit Summary that nursing will review with you.  You may have some home medications which will be placed on hold until you complete the course of blood thinner medication.  It is important for you to complete the blood thinner medication as prescribed.  PRECAUTIONS:  If you experience chest pain or shortness of breath - call 911 immediately for transfer to the hospital emergency department.   If you develop a fever greater that 101 F, purulent drainage from wound, increased redness or drainage from wound, foul odor from the wound/dressing, or calf pain - CONTACT YOUR SURGEON.                                                   FOLLOW-UP APPOINTMENTS:  If you do not already have a post-op appointment, please call the office for an appointment to be seen by your  surgeon.  Guidelines for how soon to be seen are listed in your After Visit Summary, but are typically between 1-4 weeks after surgery.  OTHER INSTRUCTIONS:     MAKE SURE YOU:   Understand these instructions.   Get help right away if you are not doing well or get worse.    Thank you for letting us be a part of your medical care team.  It is a privilege we respect greatly.  We hope these instructions will help you stay on track for a fast and full recovery!

## 2018-01-06 NOTE — Progress Notes (Signed)
Orthopedic Tech Progress Note Patient Details:  Tracy Buckley 02-24-1951 [id number]  CPM Left Knee CPM Left Knee: On Left Knee Flexion (Degrees): 90 Left Knee Extension (Degrees): 0 Additional Comments: trapeze bar patient helper  Post Interventions Patient Tolerated: Well Instructions Provided: Care of device  Nikki Dom 01/06/2018, 11:04 AM Viewed order from doctor's order list

## 2018-01-07 ENCOUNTER — Other Ambulatory Visit: Payer: Self-pay

## 2018-01-07 ENCOUNTER — Encounter (HOSPITAL_COMMUNITY): Payer: Self-pay | Admitting: Orthopedic Surgery

## 2018-01-07 DIAGNOSIS — M1712 Unilateral primary osteoarthritis, left knee: Secondary | ICD-10-CM | POA: Diagnosis not present

## 2018-01-07 MED ORDER — SODIUM CHLORIDE 0.9% FLUSH: 10.0000 mL | INTRAVENOUS | Status: DC | PRN

## 2018-01-07 NOTE — Addendum Note (Signed)
Addendum  created 01/07/18 1222 by Bethena Midget, MD   Sign clinical note

## 2018-01-07 NOTE — Progress Notes (Signed)
Physical Therapy Treatment Patient Details Name: Tracy Buckley MRN: [id number] DOB: September 20, 1951 Today's Date: 01/07/2018    History of Present Illness Pt is a 67 y/o female s/p L unicompartmental knee replacement. PMH includes nonischemic cardiomyopathy, PVD, HTN, fibromyalgia, back surgery, and R partial knee replacement.     PT Comments    Patient progressing with therapy this visit. Introduced Museum/gallery curator, patient currently min guard level with intermittent cueing required for safe ambulation. Will visit patient once more this afternoon to ensure cary over and safety, ancipitate patient will be able to progress independence with one more session.     Follow Up Recommendations  Follow surgeon's recommendation for DC plan and follow-up therapies;Supervision for mobility/OOB     Equipment Recommendations  Rolling walker with 5" wheels;3in1 (PT)    Recommendations for Other Services       Precautions / Restrictions Precautions Precautions: Knee Precaution Booklet Issued: Yes (comment) Precaution Comments: verbally reviewed  Restrictions Weight Bearing Restrictions: Yes LLE Weight Bearing: Weight bearing as tolerated    Mobility  Bed Mobility               General bed mobility comments: OOB at entry  Transfers Overall transfer level: Needs assistance Equipment used: Rolling walker (2 wheeled) Transfers: Sit to/from Stand Sit to Stand: Supervision         General transfer comment: supervision for safety  Ambulation/Gait Ambulation/Gait assistance: Min guard;Supervision Ambulation Distance (Feet): 50 Feet Assistive device: Rolling walker (2 wheeled) Gait Pattern/deviations: Step-to pattern;Antalgic Gait velocity: decreased   General Gait Details: slowly improving mechanics, now progressed to supervision    Stairs Stairs: Yes   Stair Management: Backwards;With walker;Step to pattern Number of Stairs: 12 General stair comments: cues for sequencing  and technique with RW. educated friend for guarding and stabilziation of RW. Patient with cues during and will need 1 more visit to ensure safety and independence for home return  Wheelchair Mobility    Modified Rankin (Stroke Patients Only)       Balance Overall balance assessment: Needs assistance Sitting-balance support: No upper extremity supported;Feet supported Sitting balance-Leahy Scale: Good     Standing balance support: Bilateral upper extremity supported;Single extremity supported;During functional activity;No upper extremity supported Standing balance-Leahy Scale: Fair Standing balance comment: Reliant on RW for dynamic standing balance; able to static stand without UE support with close minguard                             Cognition Arousal/Alertness: Lethargic;Awake/alert Behavior During Therapy: WFL for tasks assessed/performed Overall Cognitive Status: Within Functional Limits for tasks assessed                                 General Comments: pt initially very lethargic, more awake/alert once up and moving; pt does require min verbal safety cues during session completion       Exercises      General Comments        Pertinent Vitals/Pain Pain Assessment: Faces Faces Pain Scale: Hurts even more Pain Location: L knee, back  Pain Descriptors / Indicators: Aching;Operative site guarding Pain Intervention(s): Limited activity within patient's tolerance;Monitored during session;Premedicated before session;Repositioned    Home Living Family/patient expects to be discharged to:: Private residence Living Arrangements: Other relatives(sister ) Available Help at Discharge: Family;Available 24 hours/day Type of Home: House Home Access: Stairs to enter Entrance Stairs-Rails: None  Home Layout: One level Home Equipment: Cane - single point      Prior Function Level of Independence: Independent with assistive device(s)          PT  Goals (current goals can now be found in the care plan section) Acute Rehab PT Goals Patient Stated Goal: to decrease pain  PT Goal Formulation: With patient Time For Goal Achievement: 01/20/18 Potential to Achieve Goals: Good Progress towards PT goals: Progressing toward goals    Frequency    7X/week      PT Plan Current plan remains appropriate    Co-evaluation              AM-PAC PT "6 Clicks" Daily Activity  Outcome Measure  Difficulty turning over in bed (including adjusting bedclothes, sheets and blankets)?: A Little Difficulty moving from lying on back to sitting on the side of the bed? : A Little Difficulty sitting down on and standing up from a chair with arms (e.g., wheelchair, bedside commode, etc,.)?: A Little Help needed moving to and from a bed to chair (including a wheelchair)?: A Little Help needed walking in hospital room?: A Lot Help needed climbing 3-5 steps with a railing? : A Lot 6 Click Score: 16    End of Session Equipment Utilized During Treatment: Gait belt Activity Tolerance: Patient limited by pain Patient left: in chair;with call bell/phone within reach;with nursing/sitter in room;with family/visitor present Nurse Communication: Mobility status;Patient requests pain meds PT Visit Diagnosis: Other abnormalities of gait and mobility (R26.89);Pain Pain - Right/Left: Left Pain - part of body: Knee     Time: 1610-9604 PT Time Calculation (min) (ACUTE ONLY): 28 min  Charges:  $Gait Training: 23-37 mins                    G Codes:      Etta Grandchild, PT, DPT Acute Rehab Services Pager: [phone]    Etta Grandchild 01/07/2018, 2:22 PM

## 2018-01-07 NOTE — Care Management Note (Signed)
Case Management Note  Patient Details  Name: Tracy Buckley MRN: [id number] Date of Birth: 1950-10-30  Subjective/Objective:    67 yr old female s/p left unicompartmental knee.                 Action/Plan: case manager spoke with patient concerning discharge plan and DME. Patient was preoperatively setup with [facility], no changes. She doesn't want CPM machine because it isnt covered by insurance. Patient says she has to locate her walker. Unfortunately, if she doesn't they will have to purchase another one, insurance covered walker last year. She will have family support at discharge.    Expected Discharge Date:  01/07/18               Expected Discharge Plan:  Home w Home Health Services  In-House Referral:  NA  Discharge planning Services  CM Consult  Post Acute Care Choice:  Home Health, Durable Medical Equipment Choice offered to:  Patient  DME Arranged:  (patient not wanting CPM) DME Agency:  [employer]  HH Arranged:  PT HH Agency:  [facility] (formerly [facility]  Status of Service:  Completed, signed off  If discussed at [employer], dates discussed:    Additional Comments:  Durenda Guthrie, RN 01/07/2018, 12:40 PM

## 2018-01-07 NOTE — Progress Notes (Signed)
Physical Therapy Treatment Patient Details Name: Tracy Buckley MRN: [id number] DOB: 1951-04-08 Today's Date: 01/07/2018    History of Present Illness Pt is a 67 y/o female s/p L unicompartmental knee replacement. PMH includes nonischemic cardiomyopathy, PVD, HTN, fibromyalgia, back surgery, and R partial knee replacement.     PT Comments    Patient unable to progress this evening due to high levels of pain. Session focused on reinforcing sequencing on stairs, pt able to recall technique well but still min guard for safety due to pain this visit. Feel patient is falls risk to return home this evening with high pain, discussed with RN.  Will plan to visit PT tomorrow and ensure safety/independence with stairs and gait. Feel she will do well and be ready once pain under control.     Follow Up Recommendations  Follow surgeon's recommendation for DC plan and follow-up therapies;Supervision for mobility/OOB     Equipment Recommendations  Rolling walker with 5" wheels;3in1 (PT)    Recommendations for Other Services       Precautions / Restrictions Precautions Precautions: Knee Precaution Booklet Issued: Yes (comment) Precaution Comments: verbally reviewed  Restrictions Weight Bearing Restrictions: Yes LLE Weight Bearing: Weight bearing as tolerated    Mobility  Bed Mobility Overal bed mobility: Needs Assistance Bed Mobility: Sit to Supine       Sit to supine: Min guard   General bed mobility comments: minguard for safety   Transfers Overall transfer level: Needs assistance Equipment used: Rolling walker (2 wheeled) Transfers: Sit to/from Stand Sit to Stand: Supervision         General transfer comment: supervision for safety; VC's for hand placement  Ambulation/Gait Ambulation/Gait assistance: Min guard;Supervision Ambulation Distance (Feet): 40 Feet Assistive device: Rolling walker (2 wheeled) Gait Pattern/deviations: Step-to pattern;Antalgic Gait velocity:  decreased   General Gait Details: limited this visit due to pain. slow and painful gait with no progression.   Stairs Stairs: Yes   Stair Management: Backwards;With walker;Step to pattern Number of Stairs: 4 General stair comments: reinforced sequencing. patient limited by pain this visit but able to do x4 stairs with RW. Recalled technique from earlier session .  Wheelchair Mobility    Modified Rankin (Stroke Patients Only)       Balance Overall balance assessment: Needs assistance Sitting-balance support: No upper extremity supported;Feet supported Sitting balance-Leahy Scale: Good     Standing balance support: Bilateral upper extremity supported;Single extremity supported;During functional activity;No upper extremity supported Standing balance-Leahy Scale: Fair Standing balance comment: Reliant on RW for dynamic standing balance; able to static stand without UE support with close minguard                             Cognition Arousal/Alertness: Lethargic;Awake/alert Behavior During Therapy: WFL for tasks assessed/performed Overall Cognitive Status: Within Functional Limits for tasks assessed                                 General Comments: pt requires intermittent verbal safety cues as pt attempting to stand without RW in front of her; requires min verbal cues for proper recall of tub transfer sequence      Exercises      General Comments        Pertinent Vitals/Pain Pain Assessment: Faces Faces Pain Scale: Hurts even more Pain Location: L knee, back  Pain Descriptors / Indicators: Aching;Operative site guarding Pain  Intervention(s): Limited activity within patient's tolerance;Monitored during session;Premedicated before session;Repositioned    Home Living                      Prior Function            PT Goals (current goals can now be found in the care plan section) Acute Rehab PT Goals Patient Stated Goal: to  decrease pain  PT Goal Formulation: With patient Time For Goal Achievement: 01/20/18 Potential to Achieve Goals: Good Progress towards PT goals: Progressing toward goals    Frequency    7X/week      PT Plan Current plan remains appropriate    Co-evaluation              AM-PAC PT "6 Clicks" Daily Activity  Outcome Measure  Difficulty turning over in bed (including adjusting bedclothes, sheets and blankets)?: A Little Difficulty moving from lying on back to sitting on the side of the bed? : A Little Difficulty sitting down on and standing up from a chair with arms (e.g., wheelchair, bedside commode, etc,.)?: A Little Help needed moving to and from a bed to chair (including a wheelchair)?: A Little Help needed walking in hospital room?: A Lot Help needed climbing 3-5 steps with a railing? : A Lot 6 Click Score: 16    End of Session Equipment Utilized During Treatment: Gait belt Activity Tolerance: Patient limited by pain Patient left: with family/visitor present;in bed;with call bell/phone within reach Nurse Communication: Mobility status;Patient requests pain meds PT Visit Diagnosis: Other abnormalities of gait and mobility (R26.89);Pain Pain - Right/Left: Left Pain - part of body: Knee     Time: 1700-1727 PT Time Calculation (min) (ACUTE ONLY): 27 min  Charges:  $Gait Training: 8-22 mins                    G Codes:       Etta Grandchild, PT, DPT Acute Rehab Services Pager: [phone]     Etta Grandchild 01/07/2018, 5:28 PM

## 2018-01-07 NOTE — Progress Notes (Addendum)
Occupational Therapy Treatment Patient Details Name: Tracy Buckley MRN: [id number] DOB: February 08, 1951 Today's Date: 01/07/2018    History of present illness Pt is a 67 y/o female s/p L unicompartmental knee replacement. PMH includes nonischemic cardiomyopathy, PVD, HTN, fibromyalgia, back surgery, and R partial knee replacement.    OT comments  Pt seen for additional visit in anticipation that pt may discharge home this PM. Pt progressing towards goals and motivated to work with therapy. Pt completing room level functional mobility, toileting and grooming ADLs with minguard assist using RW. Reviewed safe tub transfer to 3:1 with pt return demonstrating with verbal cues for sequencing and overall MinA using RW. Will continue to follow acutely to progress pt towards established OT goals.    Follow Up Recommendations  Follow surgeon's recommendation for DC plan and follow-up therapies;Supervision/Assistance - 24 hour    Equipment Recommendations  3 in 1 bedside commode          Precautions / Restrictions Precautions Precautions: Knee Precaution Booklet Issued: Yes (comment) Precaution Comments: verbally reviewed  Restrictions Weight Bearing Restrictions: Yes LLE Weight Bearing: Weight bearing as tolerated       Mobility Bed Mobility Overal bed mobility: Needs Assistance Bed Mobility: Sit to Supine       Sit to supine: Min guard   General bed mobility comments: minguard for safety   Transfers Overall transfer level: Needs assistance Equipment used: Rolling walker (2 wheeled) Transfers: Sit to/from Stand Sit to Stand: Supervision         General transfer comment: supervision for safety; VC's for hand placement    Balance Overall balance assessment: Needs assistance Sitting-balance support: No upper extremity supported;Feet supported Sitting balance-Leahy Scale: Good     Standing balance support: Bilateral upper extremity supported;Single extremity supported;During  functional activity;No upper extremity supported Standing balance-Leahy Scale: Fair Standing balance comment: Reliant on RW for dynamic standing balance; able to static stand without UE support with close minguard                            ADL either performed or assessed with clinical judgement   ADL Overall ADL's : Needs assistance/impaired     Grooming: Wash/dry hands;Min guard;Standing           Upper Body Dressing : Minimal assistance;Standing Upper Body Dressing Details (indicate cue type and reason): doffing/donning new gown      Toilet Transfer: Min guard;Ambulation;BSC;RW Toilet Transfer Details (indicate cue type and reason): BSC over toilet  Toileting- Clothing Manipulation and Hygiene: Min guard;Sit to/from stand Toileting - Clothing Manipulation Details (indicate cue type and reason): pt performing pericare and gown management in standing with minguard for safety  Tub/ Shower Transfer: Tub transfer;Minimal assistance;Ambulation;3 in 1;Rolling walker Tub/Shower Transfer Details (indicate cue type and reason): pt completing x2, requires cues for recall of proper transfer sequence Functional mobility during ADLs: Min guard;Rolling walker General ADL Comments: seen for second treatment session to address tub transfer as pt may be discharging home today; pt completing with minA using RW; pt reports feeling "swimmy headed" after transfer back to bed end of session, VSS with BP 136/74                       Cognition Arousal/Alertness: Lethargic;Awake/alert Behavior During Therapy: WFL for tasks assessed/performed Overall Cognitive Status: Within Functional Limits for tasks assessed  General Comments: pt requires intermittent verbal safety cues as pt attempting to stand without RW in front of her; requires min verbal cues for proper recall of tub transfer sequence                          Pertinent  Vitals/ Pain       Pain Assessment: Faces Faces Pain Scale: Hurts even more Pain Location: L knee, back  Pain Descriptors / Indicators: Aching;Operative site guarding Pain Intervention(s): Limited activity within patient's tolerance;Monitored during session;Repositioned;Patient requesting pain meds-RN notified                                                          Frequency  Min 2X/week        Progress Toward Goals  OT Goals(current goals can now be found in the care plan section)  Progress towards OT goals: Progressing toward goals  Acute Rehab OT Goals Patient Stated Goal: to decrease pain  OT Goal Formulation: With patient Time For Goal Achievement: 01/21/18 Potential to Achieve Goals: Good  Plan Discharge plan remains appropriate                     AM-PAC PT "6 Clicks" Daily Activity     Outcome Measure   Help from another person eating meals?: None Help from another person taking care of personal grooming?: A Little Help from another person toileting, which includes using toliet, bedpan, or urinal?: A Little Help from another person bathing (including washing, rinsing, drying)?: A Lot Help from another person to put on and taking off regular upper body clothing?: None Help from another person to put on and taking off regular lower body clothing?: A Lot 6 Click Score: 18    End of Session Equipment Utilized During Treatment: Gait belt;Rolling walker  OT Visit Diagnosis: Other abnormalities of gait and mobility (R26.89)   Activity Tolerance Patient tolerated treatment well   Patient Left in bed;with call bell/phone within reach;with family/visitor present;with nursing/sitter in room   Nurse Communication Mobility status        Time: 1610-9604 OT Time Calculation (min): 44 min  Charges: OT General Charges $OT Visit: 1 Visit OT Treatments $Self Care/Home Management : 38-52 mins  Marcy Siren, OT Pager  [phone] 01/07/2018  Orlando Penner 01/07/2018, 4:13 PM

## 2018-01-07 NOTE — Evaluation (Signed)
Occupational Therapy Evaluation Patient Details Name: Tracy Buckley MRN: [id number] DOB: 1950-11-13 Today's Date: 01/07/2018    History of Present Illness Pt is a 67 y/o female s/p L unicompartmental knee replacement. PMH includes nonischemic cardiomyopathy, PVD, HTN, fibromyalgia, back surgery, and R partial knee replacement.    Clinical Impression   This 67 y/o F presents with the above. At baseline pt is mod independent with ADLs and functional mobility using SPC. Pt requiring overall minguard for functional mobility this session using RW, currently requires ModA for LB ADLs. Pt with post-op pain and decreased activity tolerance limiting her overall functional performance. Will continue to follow while in acute setting to address tub transfer as well to to progress Pt's overall safety and independence with ADLs and mobility prior to discharge.     Follow Up Recommendations  Follow surgeon's recommendation for DC plan and follow-up therapies;Supervision/Assistance - 24 hour    Equipment Recommendations  3 in 1 bedside commode           Precautions / Restrictions Precautions Precautions: Knee Precaution Booklet Issued: Yes (comment) Precaution Comments: verbally reviewed  Restrictions Weight Bearing Restrictions: Yes LLE Weight Bearing: Weight bearing as tolerated      Mobility Bed Mobility Overal bed mobility: Needs Assistance Bed Mobility: Supine to Sit;Sit to Supine     Supine to sit: Min guard Sit to supine: Min guard   General bed mobility comments: OOB in recliner upon arrival   Transfers Overall transfer level: Needs assistance Equipment used: Rolling walker (2 wheeled) Transfers: Sit to/from Stand Sit to Stand: Min guard         General transfer comment: min guard for safety,cues for hand placement, pt able to sit to stand without assistance; stood from recliner and BSC     Balance Overall balance assessment: Needs assistance Sitting-balance support:  No upper extremity supported;Feet supported Sitting balance-Leahy Scale: Good     Standing balance support: Bilateral upper extremity supported;Single extremity supported;During functional activity;No upper extremity supported Standing balance-Leahy Scale: Fair Standing balance comment: Reliant on RW for dynamic standing balance; able to static stand without UE support with close minguard                            ADL either performed or assessed with clinical judgement   ADL Overall ADL's : Needs assistance/impaired Eating/Feeding: Modified independent;Sitting   Grooming: Wash/dry hands;Wash/dry face;Min guard;Standing   Upper Body Bathing: Min guard;Sitting   Lower Body Bathing: Min guard;Sit to/from stand   Upper Body Dressing : Min guard;Sitting   Lower Body Dressing: Moderate assistance;Sit to/from stand   Toilet Transfer: Minimal assistance;Min Nurse, learning disability Details (indicate cue type and reason): BSC over toilet  Toileting- Clothing Manipulation and Hygiene: Min guard;Sit to/from stand Toileting - Clothing Manipulation Details (indicate cue type and reason): pt performing pericare in standing with minguard for safety    Tub/Shower Transfer Details (indicate cue type and reason): educated on use of 3:1 in shower as shower seat; will need to practice tub transfer  Functional mobility during ADLs: Min guard;Rolling walker General ADL Comments: pt completing room level functional mobility, toileting and standing grooming ADLs; pt reporting increased fatigue and needing to rest after grooming completion, returned to recliner to rest                          Pertinent Vitals/Pain Pain Assessment: Faces Faces Pain Scale: Hurts even  more Pain Location: L knee, back  Pain Descriptors / Indicators: Aching;Operative site guarding Pain Intervention(s): Monitored during session;Limited activity within patient's tolerance;Repositioned     Hand  Dominance Right   Extremity/Trunk Assessment Upper Extremity Assessment Upper Extremity Assessment: Overall WFL for tasks assessed   Lower Extremity Assessment Lower Extremity Assessment: Defer to PT evaluation   Cervical / Trunk Assessment Cervical / Trunk Assessment: Kyphotic   Communication Communication Communication: No difficulties   Cognition Arousal/Alertness: Lethargic;Awake/alert Behavior During Therapy: WFL for tasks assessed/performed Overall Cognitive Status: Within Functional Limits for tasks assessed                                 General Comments: pt initially very lethargic, more awake/alert once up and moving; pt does require min verbal safety cues during session completion                     Home Living Family/patient expects to be discharged to:: Private residence Living Arrangements: Other relatives(sister ) Available Help at Discharge: Family;Available 24 hours/day Type of Home: House Home Access: Stairs to enter [employer] of Steps: 2 Entrance Stairs-Rails: None Home Layout: One level     Bathroom Shower/Tub: Chief Strategy Officer: Standard     Home Equipment: Cane - single point          Prior Functioning/Environment Level of Independence: Independent with assistive device(s)                 OT Problem List: Decreased strength;Impaired balance (sitting and/or standing);Decreased knowledge of use of DME or AE;Decreased knowledge of precautions;Decreased activity tolerance      OT Treatment/Interventions: Self-care/ADL training;DME and/or AE instruction;Therapeutic activities;Balance training;Therapeutic exercise;Energy conservation;Patient/family education    OT Goals(Current goals can be found in the care plan section) Acute Rehab OT Goals Patient Stated Goal: to decrease pain  OT Goal Formulation: With patient Time For Goal Achievement: 01/21/18 Potential to Achieve Goals: Good  OT  Frequency: Min 2X/week                             AM-PAC PT "6 Clicks" Daily Activity     Outcome Measure Help from another person eating meals?: None Help from another person taking care of personal grooming?: A Little Help from another person toileting, which includes using toliet, bedpan, or urinal?: A Little Help from another person bathing (including washing, rinsing, drying)?: A Lot Help from another person to put on and taking off regular upper body clothing?: None Help from another person to put on and taking off regular lower body clothing?: A Lot 6 Click Score: 18   End of Session Equipment Utilized During Treatment: Gait belt;Rolling walker Nurse Communication: Mobility status  Activity Tolerance: Patient tolerated treatment well Patient left: in chair;with call bell/phone within reach;with family/visitor present  OT Visit Diagnosis: Other abnormalities of gait and mobility (R26.89)                Time: 1610-9604 OT Time Calculation (min): 25 min Charges:  OT General Charges $OT Visit: 1 Visit OT Evaluation $OT Eval Low Complexity: 1 Low OT Treatments $Self Care/Home Management : 8-22 mins G-Codes:     Tracy Buckley, OT Pager [phone] 01/07/2018   Tracy Buckley 01/07/2018, 10:44 AM

## 2018-01-07 NOTE — Anesthesia Postprocedure Evaluation (Signed)
Anesthesia Post Note  Patient: Tracy Buckley  Procedure(s) Performed: UNICOMPARTMENTAL KNEE (Left Knee)     Patient location during evaluation: PACU Anesthesia Type: General Level of consciousness: awake and alert Pain management: pain level controlled Vital Signs Assessment: post-procedure vital signs reviewed and stable Respiratory status: spontaneous breathing, nonlabored ventilation, respiratory function stable and patient connected to nasal cannula oxygen Cardiovascular status: blood pressure returned to baseline and stable Postop Assessment: no apparent nausea or vomiting Anesthetic complications: no    Last Vitals:  Vitals:   01/07/18 0800 01/07/18 1003  BP: 117/69 117/69  Pulse: 63   Resp:    Temp:    SpO2:      Last Pain:  Vitals:   01/07/18 1128  TempSrc:   PainSc: 4                  Jowan Skillin

## 2018-01-07 NOTE — Progress Notes (Signed)
    Subjective: Patient reports pain as moderate.  Tolerating diet.  Urinating.   No CP, SOB.  OOB in room to bathroom.  Objective:   VITALS:   Vitals:   01/06/18 1525 01/06/18 2140 01/06/18 2251 01/07/18 0630  BP: (!) 145/88 126/76 120/63 117/69  Pulse:  81 82 63  Resp:    16  Temp:  97.8 F (36.6 C)  97.8 F (36.6 C)  TempSrc:  Oral  Oral  SpO2:  95%  97%  Weight:       CBC Latest Ref Rng & Units 12/26/2017 01/17/2017 12/01/2015  WBC 4.0 - 10.5 K/uL 3.0(L) 4.1 2.9(L)  Hemoglobin 12.0 - 15.0 g/dL 11.1(L) 11.1(L) 11.5(L)  Hematocrit 36.0 - 46.0 % 35.9(L) 34.6(L) 37.5  Platelets 150 - 400 K/uL 208 209 237   BMP Latest Ref Rng & Units 12/26/2017 01/17/2017 12/01/2015  Glucose 65 - 99 mg/dL 671(I) 458(K) 998(P)  BUN 6 - 20 mg/dL 13 11 13   Creatinine 0.44 - 1.00 mg/dL 3.82 5.05 3.97  Sodium 135 - 145 mmol/L 139 142 144  Potassium 3.5 - 5.1 mmol/L 4.2 3.5 4.6  Chloride 101 - 111 mmol/L 103 104 104  CO2 22 - 32 mmol/L 28 32 30  Calcium 8.9 - 10.3 mg/dL 8.9 9.0 9.7   Intake/Output      03/26 0701 - 03/27 0700 03/27 0701 - 03/28 0700   P.O. 480    I.V. (mL/kg) 2200 (22.8)    Other 75    Total Intake(mL/kg) 2755 (28.5)    Urine (mL/kg/hr) 600 (0.3)    Blood 40    Total Output 640    Net +2115         Urine Occurrence 2 x       Physical Exam: General: NAD.  Supine in bed.  Calm, sleepy but conversant.  Sister at bedside Resp: No increased wob Cardio: regular rate and rhythm ABD soft Neurologically intact MSK Neurovascularly intact Sensation intact distally Intact pulses distally Dorsiflexion/Plantar flexion intact Incision: dressing C/D/I   Assessment: 1 Day Post-Op  S/P Procedure(s) (LRB): UNICOMPARTMENTAL KNEE (Left) by Dr. Jewel Baize. [name] on 01/06/2018  Principal Problem:   Primary osteoarthritis of left knee Active Problems:   Nonischemic cardiomyopathy (HCC)   Essential hypertension, benign   Mixed hyperlipidemia   Chronic pain   Primary  osteoarthritis of knee   Primary osteoarthritis, status post unicompartmental knee arthroplasty  Making progress postop day 1.  Mobilizing some but has not walked yet with therapy-limited somewhat by difficult pain control.  She takes higher doses of pain medicines chronically primarily for back pain which limits pain control after her knee surgery.  Pain is under decent control with current regimen.  Eating, drinking, and voiding  I anticipate that she will be able to discharge later today or second therapy session, but this may be limited by pain and/or mobility  Plan: Advance diet Up with therapy D/C IV fluids Incentive Spirometry Elevate and Apply ice CPM, bone foam  Weight Bearing: Weight Bearing as Tolerated (WBAT) LLE Dressings: Maintain Mepilex.  Please apply thigh high TED hose to operative leg prior to discharge. VTE prophylaxis: Aspirin, SCDs, ambulation Dispo: Home after therapy sessions today if mobilizing safely.   Patient's anticipated LOS is less than 2 midnights.   -She does take chronic pain medicine which may make pain control more difficult.  Albina Billet III, PA-C 01/07/2018, 7:37 AM

## 2018-01-07 NOTE — Progress Notes (Signed)
Physical Therapy Treatment Patient Details Name: Tracy Buckley MRN: [id number] DOB: 1951-10-03 Today's Date: 01/07/2018    History of Present Illness Pt is a 67 y/o female s/p L unicompartmental knee replacement. PMH includes nonischemic cardiomyopathy, PVD, HTN, fibromyalgia, back surgery, and R partial knee replacement.     PT Comments    Patient with mild progression this visit. Able to mobilize out of bed for first time with extremely slow and painful gait and min guard for safety. Walked 50' limited by pain. Plan to attempt stair training this afternoon if tolerable.    Follow Up Recommendations  Follow surgeon's recommendation for DC plan and follow-up therapies;Supervision for mobility/OOB     Equipment Recommendations  Rolling walker with 5" wheels;3in1 (PT)    Recommendations for Other Services       Precautions / Restrictions Precautions Precautions: Knee Precaution Booklet Issued: Yes (comment) Precaution Comments: Reviewed supine HEP and knee precautions.  Restrictions Weight Bearing Restrictions: Yes LLE Weight Bearing: Weight bearing as tolerated    Mobility  Bed Mobility Overal bed mobility: Needs Assistance Bed Mobility: Supine to Sit;Sit to Supine     Supine to sit: Min guard Sit to supine: Min guard   General bed mobility comments: min guard, cues for use of sheet to assist surgical limb over EOB  Transfers Overall transfer level: Needs assistance Equipment used: Rolling walker (2 wheeled) Transfers: Sit to/from Stand Sit to Stand: Min guard         General transfer comment: min guard for safety,cues for hand placement, pt able to sit to stand without assistance.   Ambulation/Gait Ambulation/Gait assistance: Min guard;Supervision Ambulation Distance (Feet): 50 Feet Assistive device: Rolling walker (2 wheeled) Gait Pattern/deviations: Step-to pattern;Antalgic Gait velocity: decreased   General Gait Details: Pt ambulating atanglic and  slow with min guard for safety. Cues for step length and proximity to RW.    Stairs            Wheelchair Mobility    Modified Rankin (Stroke Patients Only)       Balance Overall balance assessment: Needs assistance Sitting-balance support: No upper extremity supported;Feet supported Sitting balance-Leahy Scale: Good       Standing balance-Leahy Scale: Poor Standing balance comment: Reliant on RW for standing balance                            Cognition Arousal/Alertness: Awake/alert Behavior During Therapy: WFL for tasks assessed/performed Overall Cognitive Status: Within Functional Limits for tasks assessed                                        Exercises      General Comments        Pertinent Vitals/Pain Pain Assessment: Faces Faces Pain Scale: Hurts whole lot Pain Location: L knee  Pain Descriptors / Indicators: Aching;Operative site guarding Pain Intervention(s): Limited activity within patient's tolerance;Monitored during session;Premedicated before session;Repositioned    Home Living                      Prior Function            PT Goals (current goals can now be found in the care plan section) Acute Rehab PT Goals Patient Stated Goal: to decrease pain  PT Goal Formulation: With patient Time For Goal Achievement: 01/20/18 Potential to Achieve Goals:  Good Progress towards PT goals: Progressing toward goals    Frequency    7X/week      PT Plan Current plan remains appropriate    Co-evaluation              AM-PAC PT "6 Clicks" Daily Activity  Outcome Measure  Difficulty turning over in bed (including adjusting bedclothes, sheets and blankets)?: A Little Difficulty moving from lying on back to sitting on the side of the bed? : A Little Difficulty sitting down on and standing up from a chair with arms (e.g., wheelchair, bedside commode, etc,.)?: A Little Help needed moving to and from a bed  to chair (including a wheelchair)?: A Little Help needed walking in hospital room?: A Lot Help needed climbing 3-5 steps with a railing? : A Lot 6 Click Score: 16    End of Session Equipment Utilized During Treatment: Gait belt Activity Tolerance: Patient limited by pain Patient left: in chair;with call bell/phone within reach;with nursing/sitter in room;with family/visitor present Nurse Communication: Mobility status;Patient requests pain meds PT Visit Diagnosis: Other abnormalities of gait and mobility (R26.89);Pain Pain - Right/Left: Left Pain - part of body: Knee     Time: 0720-0750 PT Time Calculation (min) (ACUTE ONLY): 30 min  Charges:  $Gait Training: 8-22 mins $Therapeutic Activity: 8-22 mins                    G Codes:       Etta Grandchild, PT, DPT Acute Rehab Services Pager: [phone]     Etta Grandchild 01/07/2018, 8:01 AM

## 2018-01-08 ENCOUNTER — Encounter (HOSPITAL_COMMUNITY): Payer: Self-pay | Admitting: Orthopedic Surgery

## 2018-01-08 DIAGNOSIS — M1712 Unilateral primary osteoarthritis, left knee: Secondary | ICD-10-CM | POA: Diagnosis not present

## 2018-01-08 NOTE — Progress Notes (Signed)
    Subjective: Patient successfully mobilized to the bathroom without any assistance this morning. Patient reports pain as moderate.  Tolerating diet.  Urinating.   No CP, SOB.  OOB Walking up to 50 feet x3 with PT.    Objective:   VITALS:   Vitals:   01/07/18 1303 01/07/18 1608 01/07/18 2214 01/08/18 0550  BP: 109/62 109/62 130/66 132/69  Pulse: 65 65 77 71  Resp: 18  20 18   Temp: 97.8 F (36.6 C)  98.3 F (36.8 C) 97.8 F (36.6 C)  TempSrc: Oral  Oral Oral  SpO2: 96%  97% 96%  Weight:       CBC Latest Ref Rng & Units 12/26/2017 01/17/2017 12/01/2015  WBC 4.0 - 10.5 K/uL 3.0(L) 4.1 2.9(L)  Hemoglobin 12.0 - 15.0 g/dL 11.1(L) 11.1(L) 11.5(L)  Hematocrit 36.0 - 46.0 % 35.9(L) 34.6(L) 37.5  Platelets 150 - 400 K/uL 208 209 237   BMP Latest Ref Rng & Units 12/26/2017 01/17/2017 12/01/2015  Glucose 65 - 99 mg/dL 967(E) 938(B) 017(P)  BUN 6 - 20 mg/dL 13 11 13   Creatinine 0.44 - 1.00 mg/dL 1.02 5.85 2.77  Sodium 135 - 145 mmol/L 139 142 144  Potassium 3.5 - 5.1 mmol/L 4.2 3.5 4.6  Chloride 101 - 111 mmol/L 103 104 104  CO2 22 - 32 mmol/L 28 32 30  Calcium 8.9 - 10.3 mg/dL 8.9 9.0 9.7   Intake/Output    None      Physical Exam: General: NAD.  Supine in bed.  Calm, conversant.   Resp: No increased wob Cardio: regular rate and rhythm ABD soft Neurologically intact MSK Neurovascularly intact Sensation intact distally Intact pulses distally Dorsiflexion/Plantar flexion intact Incision: dressing C/D/I   Assessment: 2 Days Post-Op  S/P Procedure(s) (LRB): UNICOMPARTMENTAL KNEE (Left) by Dr. Jewel Baize. [name] on 01/06/2018  Principal Problem:   Primary osteoarthritis of left knee Active Problems:   Nonischemic cardiomyopathy (HCC)   Essential hypertension, benign   Mixed hyperlipidemia   Chronic pain   Primary osteoarthritis of knee   Primary osteoarthritis, status post unicompartmental knee arthroplasty  Making progress postop day 2.  Patient had trouble with  mobilization and pain control yesterday and therefore remained inpatient.  She believes this has improved and expects to go home after a.m. therapy session today.  AFVSN.  Eating, drinking, and voiding   Plan: Up with therapy this morning Incentive Spirometry Elevate and Apply ice CPM, bone foam  Weight Bearing: Weight Bearing as Tolerated (WBAT) LLE Dressings: Maintain Mepilex.  Please apply thigh high TED hose to operative leg prior to discharge. VTE prophylaxis: Aspirin, SCDs, ambulation Pain control: Scheduled Tylenol, (chronic medicine) oxycodone.  Morphine (chronic medicine) as needed, tramadol as needed.  Chronic pain physician will resume pain management at home.  She does have medications at home. Dispo: Home this morning after therapy.   Anticipated LOS equal to or greater than 2 midnights due to - Age 3 and older with one or more of the following:  - Obesity  - Expected need for hospital services (PT, OT, Nursing) required for safe  discharge  - Anticipated need for postoperative skilled nursing care or inpatient rehab  - Active co-morbidities: Chronic pain requiring opiods OR   - Unanticipated findings during/Post Surgery: None  - Patient is a high risk of re-admission due to: None   Albina Billet III, PA-C 01/08/2018, 6:38 AM

## 2018-01-08 NOTE — Progress Notes (Signed)
Physical Therapy Treatment and Discharge Patient Details Name: Tracy Buckley MRN: [id number] DOB: 24-Jan-1951 Today's Date: 01/08/2018    History of Present Illness Pt is a 67 y/o female s/p L unicompartmental knee replacement. PMH includes nonischemic cardiomyopathy, PVD, HTN, fibromyalgia, back surgery, and R partial knee replacement.     PT Comments    Patient progressing very well this morning, now mod I for mobility and ambulating further distances with RW. Performed stairs today with supervision. Pt educated on safety considerations for home and have no further questions or concerns at this time. Pt has met all functional goals and will benefit from skilled home health PT when medically cleared for d/c.     Follow Up Recommendations  Follow surgeon's recommendation for DC plan and follow-up therapies;Supervision for mobility/OOB     Equipment Recommendations  Rolling walker with 5" wheels;3in1 (PT)    Recommendations for Other Services       Precautions / Restrictions Precautions Precautions: Knee Precaution Booklet Issued: Yes (comment) Precaution Comments: verbally reviewed  Restrictions Weight Bearing Restrictions: Yes LLE Weight Bearing: Weight bearing as tolerated    Mobility  Bed Mobility Overal bed mobility: Modified Independent                Transfers Overall transfer level: Modified independent Equipment used: Rolling walker (2 wheeled) Transfers: Sit to/from Stand Sit to Stand: Modified independent (Device/Increase time)            Ambulation/Gait Ambulation/Gait assistance: Supervision Ambulation Distance (Feet): 150 Feet Assistive device: Rolling walker (2 wheeled) Gait Pattern/deviations: Step-to pattern;Antalgic Gait velocity: decreased   General Gait Details: increased distance and tolerance, speed, intermittent step through    Stairs Stairs: Yes   Stair Management: Backwards;With walker;Step to pattern Number of Stairs:  6 General stair comments: patient demo independence with stairs, supervision for safety, edcuated on importance of having sister hold onto RW  Wheelchair Mobility    Modified Rankin (Stroke Patients Only)       Balance Overall balance assessment: Needs assistance Sitting-balance support: No upper extremity supported;Feet supported Sitting balance-Leahy Scale: Good       Standing balance-Leahy Scale: Fair Standing balance comment: Reliant on RW for dynamic standing balance; able to static stand without UE support with close minguard                             Cognition Arousal/Alertness: Awake/alert Behavior During Therapy: WFL for tasks assessed/performed Overall Cognitive Status: Within Functional Limits for tasks assessed                                        Exercises      General Comments        Pertinent Vitals/Pain Pain Assessment: Faces Faces Pain Scale: Hurts little more Pain Location: L knee, back  Pain Descriptors / Indicators: Aching;Operative site guarding Pain Intervention(s): Limited activity within patient's tolerance;Monitored during session;Premedicated before session;Repositioned    Home Living                      Prior Function            PT Goals (current goals can now be found in the care plan section) Acute Rehab PT Goals Patient Stated Goal: to decrease pain  PT Goal Formulation: With patient Time For Goal Achievement: 01/20/18 Potential to  Achieve Goals: Good Progress towards PT goals: Goals met/education completed, patient discharged from PT    Frequency           PT Plan Current plan remains appropriate    Co-evaluation              AM-PAC PT "6 Clicks" Daily Activity  Outcome Measure  Difficulty turning over in bed (including adjusting bedclothes, sheets and blankets)?: A Little Difficulty moving from lying on back to sitting on the side of the bed? : A Little Difficulty  sitting down on and standing up from a chair with arms (e.g., wheelchair, bedside commode, etc,.)?: A Little Help needed moving to and from a bed to chair (including a wheelchair)?: A Little Help needed walking in hospital room?: A Lot Help needed climbing 3-5 steps with a railing? : A Lot 6 Click Score: 16    End of Session Equipment Utilized During Treatment: Gait belt Activity Tolerance: Patient limited by pain Patient left: with family/visitor present;in bed;with call bell/phone within reach Nurse Communication: Mobility status;Patient requests pain meds PT Visit Diagnosis: Other abnormalities of gait and mobility (R26.89);Pain Pain - Right/Left: Left Pain - part of body: Knee     Time: 8338-2505 PT Time Calculation (min) (ACUTE ONLY): 16 min  Charges:  $Gait Training: 8-22 mins                    G Codes:       Reinaldo Berber, PT, DPT Acute Rehab Services Pager: [phone]    Reinaldo Berber 01/08/2018, 8:52 AM

## 2018-01-08 NOTE — Progress Notes (Signed)
Pt given prescriptions and discharge instructions, RN answered all questions to satisfaction. Pt in no distress at time of discharge. All belongings gathered to be sent home. Waiting on ride to pick her up.

## 2018-01-08 NOTE — Discharge Summary (Signed)
Discharge Summary  Patient ID: KINZEE HAPPEL MRN: [id number] DOB/AGE: [birth year] 67 y.o.  Admit date: 01/06/2018 Discharge date: 01/08/2018  Admission Diagnoses:  Primary osteoarthritis of left knee  Discharge Diagnoses:  Principal Problem:   Primary osteoarthritis of left knee Active Problems:   Nonischemic cardiomyopathy (HCC)   Essential hypertension, benign   Mixed hyperlipidemia   Chronic pain   Primary osteoarthritis of knee   Past Medical History:  Diagnosis Date  . Arthritis   . Chronic chest pain   . Depression    Admission for suicidal ideation 2001  . Diverticulitis   . Essential hypertension, benign   . Fibromyalgia   . Hypercholesteremia   . Hypothyroidism   . Myocardial infarction (HCC)    2002  . Nonischemic cardiomyopathy (HCC)     No obstructive disease at catheterization 2002 - Dr. [name] and in July of 2013. EF is 50 to 55%. Grade 1 diastolic dysfunction noted on echo 04/2012  . Obesity   . Peripheral vascular disease (HCC)   . Polysubstance abuse (HCC)     Remote history  . Shortness of breath dyspnea with exertion    Surgeries: Procedure(s): UNICOMPARTMENTAL KNEE on 01/06/2018   Consultants (if any):   Discharged Condition: Improved  Hospital Course: Tracy Buckley is an 67 y.o. female who was admitted 01/06/2018 with a diagnosis of Primary osteoarthritis of left knee and went to the operating room on 01/06/2018 and underwent the above named procedures.    She was given perioperative antibiotics:  Anti-infectives (From admission, onward)   Start     Dose/Rate Route Frequency Ordered Stop   01/06/18 1215  ceFAZolin (ANCEF) IVPB 2g/100 mL premix     2 g 200 mL/hr over 30 Minutes Intravenous Every 6 hours 01/06/18 1137 01/06/18 1742   01/06/18 0546  ceFAZolin (ANCEF) IVPB 2g/100 mL premix     2 g 200 mL/hr over 30 Minutes Intravenous On call to O.R. 01/06/18 1914 01/06/18 0747    .  She was given sequential compression devices, early  ambulation, and ASA 325 mg for DVT prophylaxis.  She benefited maximally from the hospital stay and there were no complications.    Recent vital signs:  Vitals:   01/07/18 2214 01/08/18 0550  BP: 130/66 132/69  Pulse: 77 71  Resp: 20 18  Temp: 98.3 F (36.8 C) 97.8 F (36.6 C)  SpO2: 97% 96%    Recent laboratory studies:  Lab Results  Component Value Date   HGB 11.1 (L) 12/26/2017   HGB 11.1 (L) 01/17/2017   HGB 11.5 (L) 12/01/2015   Lab Results  Component Value Date   WBC 3.0 (L) 12/26/2017   PLT 208 12/26/2017   Lab Results  Component Value Date   INR 1.06 12/01/2015   Lab Results  Component Value Date   NA 139 12/26/2017   K 4.2 12/26/2017   CL 103 12/26/2017   CO2 28 12/26/2017   BUN 13 12/26/2017   CREATININE 0.96 12/26/2017   GLUCOSE 117 (H) 12/26/2017    Discharge Medications:   Allergies as of 01/08/2018      Reactions   Biaxin [clarithromycin]    " Nausea & vomiting fever ,headache ,abdominal pain "      Medication List    TAKE these medications   acetaminophen 500 MG tablet Commonly known as:  TYLENOL Take 2 tablets (1,000 mg total) by mouth every 8 (eight) hours for 14 days. For Pain.   aspirin EC 325 MG tablet  Take 1 tablet (325 mg total) by mouth daily. For 30 days post op for DVT Prophylaxis What changed:    medication strength  how much to take  additional instructions   carvedilol 3.125 MG tablet Commonly known as:  COREG Take 3.125 mg by mouth 2 (two) times daily with a meal.   diazepam 5 MG tablet Commonly known as:  VALIUM Take 5 mg by mouth 2 (two) times daily.   docusate sodium 100 MG capsule Commonly known as:  COLACE Take 1 capsule (100 mg total) by mouth 2 (two) times daily. To prevent constipation while taking pain medication.   levothyroxine 75 MCG tablet Commonly known as:  SYNTHROID, LEVOTHROID Take 75 mcg by mouth daily before breakfast.   morphine 15 MG tablet Commonly known as:  MSIR Take 15 mg by mouth  2 (two) times daily as needed for severe pain (for pain.).   nitroGLYCERIN 0.4 MG SL tablet Commonly known as:  NITROSTAT Place 1 tablet (0.4 mg total) under the tongue every 5 (five) minutes as needed. What changed:  reasons to take this   omeprazole 20 MG capsule Commonly known as:  PRILOSEC Take 1 capsule (20 mg total) by mouth daily. 30 days for gastroprotection while taking NSAIDs.   ondansetron 4 MG tablet Commonly known as:  ZOFRAN Take 1 tablet (4 mg total) by mouth every 8 (eight) hours as needed for nausea or vomiting.   oxycodone 30 MG immediate release tablet Commonly known as:  ROXICODONE Take 30 mg by mouth 3 (three) times daily.   polyethylene glycol powder powder Commonly known as:  GLYCOLAX/MIRALAX Take 17 g by mouth daily.   pregabalin 150 MG capsule Commonly known as:  LYRICA Take 150 mg by mouth at bedtime.   ramipril 10 MG capsule Commonly known as:  ALTACE Take 10 mg by mouth daily.   simvastatin 20 MG tablet Commonly known as:  ZOCOR Take 20 mg by mouth at bedtime.   tiZANidine 4 MG tablet Commonly known as:  ZANAFLEX Take 4 mg by mouth 4 (four) times daily.   traMADol 50 MG tablet Commonly known as:  ULTRAM Take 1 tablet (50 mg total) by mouth every 6 (six) hours as needed. What changed:  reasons to take this   traZODone 50 MG tablet Commonly known as:  DESYREL Take 50 mg by mouth at bedtime.       Diagnostic Studies: Dg Knee Left Port  Result Date: 01/06/2018 CLINICAL DATA:  Unicompartmental arthroplasty EXAM: PORTABLE LEFT KNEE - 1-2 VIEW COMPARISON:  None. FINDINGS: Changes of right medial compartment hemiarthroplasty. No complicating feature. Moderate joint effusion, soft tissue and joint space gas noted. IMPRESSION: Right medial compartment hemiarthroplasty. No visible complicating feature. Electronically Signed   By: Charlett Nose M.D.   On: 01/06/2018 10:07    Disposition: Discharge disposition: 01-Home or Self  Care       Follow-up Information    [facility].   Specialty:  Home Health Services Why:  A representative from [facility] will contact you to arrange start date and time for your therapy.  Contact information: [address] [place] [address] [place] [address] [phone]            Signed: Albina Billet III PA-C 01/08/2018, 6:51 AM

## 2018-02-24 ENCOUNTER — Ambulatory Visit: Payer: Medicare Other | Attending: Physical Therapy | Admitting: Physical Therapy

## 2018-03-19 ENCOUNTER — Ambulatory Visit
Admission: RE | Admit: 2018-03-19 | Discharge: 2018-03-19 | Disposition: A | Discharge status: Home / Self Care | Payer: Medicare Other | Source: Ambulatory Visit | Attending: Internal Medicine | Admitting: Internal Medicine

## 2018-03-19 ENCOUNTER — Other Ambulatory Visit: Payer: Self-pay | Admitting: Internal Medicine

## 2018-03-19 DIAGNOSIS — Z1231 Encounter for screening mammogram for malignant neoplasm of breast: Secondary | ICD-10-CM

## 2018-04-22 ENCOUNTER — Telehealth: Payer: Self-pay | Admitting: Cardiology

## 2018-04-22 NOTE — Telephone Encounter (Signed)
Left message for patient to call back and schedule appt with dr. Swaziland in September.

## 2018-08-03 ENCOUNTER — Ambulatory Visit: Payer: Medicare Other | Admitting: Cardiology

## 2018-08-03 ENCOUNTER — Other Ambulatory Visit: Payer: Self-pay | Admitting: Physician Assistant

## 2018-08-03 DIAGNOSIS — R1084 Generalized abdominal pain: Secondary | ICD-10-CM

## 2018-08-03 DIAGNOSIS — R110 Nausea: Secondary | ICD-10-CM

## 2018-08-06 ENCOUNTER — Ambulatory Visit
Admission: RE | Admit: 2018-08-06 | Discharge: 2018-08-06 | Disposition: A | Discharge status: Home / Self Care | Payer: Medicare Other | Source: Ambulatory Visit | Attending: Physician Assistant | Admitting: Physician Assistant

## 2018-08-06 DIAGNOSIS — R1084 Generalized abdominal pain: Secondary | ICD-10-CM

## 2018-08-06 DIAGNOSIS — R110 Nausea: Secondary | ICD-10-CM

## 2018-08-06 MED ORDER — IOPAMIDOL (ISOVUE-300) INJECTION 61%
100.0000 mL | Freq: Once | INTRAVENOUS | Status: AC | PRN
  Administered 2018-08-06: 100 mL via INTRAVENOUS

## 2018-08-08 NOTE — Progress Notes (Deleted)
Cardiology Office Note    Date:  08/15/2018   ID:  Tracy Buckley, DOB 17-May-1951, MRN [id number]  PCP:  Burton Apley, MD  Cardiologist:  Dr. Swaziland  No chief complaint on file.   History of Present Illness:  Tracy Buckley is a 67 y.o. female with past medical history of hypothyroidism, hyperlipidemia, hypertension, NICM, and PAD.  She was remotely seen by Dr. [name] in 2002 for cardiomyopathy.  She then moved to [place] and followed by a cardiologist over there.  She had a normal nuclear stress test and echocardiogram in 2010.  In July 2013 she was admitted with chest pain and a cardiac catheterization at the time showed mild nonobstructive CAD.  Echocardiogram obtained on 04/16/2012 also showed normal EF with mild MR.  She was last seen by me on 11/07/2015 for preoperative clearance, she still had a very atypical chest pain at the time, therefore Lexiscan Myoview was recommended.  Myoview obtained on 11/09/2015 showed EF 60%, normal perfusion, overall low risk study.  She was seen by Azalee Course PA in March 2019 for pre op clearance for knee surgery. She had surgery on March 28 without complication.   Past Medical History:  Diagnosis Date  . Arthritis   . Chronic chest pain   . Depression    Admission for suicidal ideation 2001  . Diverticulitis   . Essential hypertension, benign   . Fibromyalgia   . Hypercholesteremia   . Hypothyroidism   . Myocardial infarction (HCC)    2002  . Nonischemic cardiomyopathy (HCC)     No obstructive disease at catheterization 2002 - Dr. [name] and in July of 2013. EF is 50 to 55%. Grade 1 diastolic dysfunction noted on echo 04/2012  . Obesity   . Peripheral vascular disease (HCC)   . Polysubstance abuse (HCC)     Remote history  . Shortness of breath dyspnea with exertion    Past Surgical History:  Procedure Laterality Date  . ABDOMINAL HYSTERECTOMY    . BACK SURGERY     x2  . CARDIAC CATHETERIZATION    . CESAREAN SECTION    . HAND  SURGERY     x2  . Hysterectomy - unknown type    . KIDNEY STONE SURGERY    . LEFT HEART CATHETERIZATION WITH CORONARY ANGIOGRAM N/A 04/17/2012   Procedure: LEFT HEART CATHETERIZATION WITH CORONARY ANGIOGRAM;  Surgeon: Kathleene Hazel, MD;  Location: [facility] CATH LAB;  Service: Cardiovascular;  Laterality: N/A;  . PARTIAL KNEE ARTHROPLASTY Right 12/12/2015   Procedure: RIGHT UNICOMPARTMENTAL KNEE;  Surgeon: Sheral Apley, MD;  Location: [facility] OR;  Service: Orthopedics;  Laterality: Right;  . PARTIAL KNEE ARTHROPLASTY Left 01/06/2018   Procedure: UNICOMPARTMENTAL KNEE;  Surgeon: Sheral Apley, MD;  Location[facility] OR;  Service: Orthopedics;  Laterality: Left;  . SHOULDER SURGERY    . Tummy tuck  2009    Current Medications: Outpatient Medications Prior to Visit  Medication Sig Dispense Refill  . aspirin EC 325 MG tablet Take 1 tablet (325 mg total) by mouth daily. For 30 days post op for DVT Prophylaxis 30 tablet 0  . carvedilol (COREG) 3.125 MG tablet Take 3.125 mg by mouth 2 (two) times daily with a meal.    . diazepam (VALIUM) 5 MG tablet Take 5 mg by mouth 2 (two) times daily.   2  . docusate sodium (COLACE) 100 MG capsule Take 1 capsule (100 mg total) by mouth 2 (two) times daily. To prevent constipation  while taking pain medication. 60 capsule 0  . levothyroxine (SYNTHROID, LEVOTHROID) 75 MCG tablet Take 75 mcg by mouth daily before breakfast.   2  . morphine (MSIR) 15 MG tablet Take 15 mg by mouth 2 (two) times daily as needed for severe pain (for pain.).    Marland Kitchen nitroGLYCERIN (NITROSTAT) 0.4 MG SL tablet Place 1 tablet (0.4 mg total) under the tongue every 5 (five) minutes as needed. (Patient taking differently: Place 0.4 mg under the tongue every 5 (five) minutes as needed for chest pain. ) 25 tablet 6  . omeprazole (PRILOSEC) 20 MG capsule Take 1 capsule (20 mg total) by mouth daily. 30 days for gastroprotection while taking NSAIDs. 30 capsule 0  . ondansetron (ZOFRAN) 4 MG tablet Take 1  tablet (4 mg total) by mouth every 8 (eight) hours as needed for nausea or vomiting. 20 tablet 0  . oxycodone (ROXICODONE) 30 MG immediate release tablet Take 30 mg by mouth 3 (three) times daily.  0  . polyethylene glycol powder (GLYCOLAX/MIRALAX) powder Take 17 g by mouth daily.    . pregabalin (LYRICA) 150 MG capsule Take 150 mg by mouth at bedtime.    . ramipril (ALTACE) 10 MG capsule Take 10 mg by mouth daily.  4  . simvastatin (ZOCOR) 20 MG tablet Take 20 mg by mouth at bedtime.     Marland Kitchen tiZANidine (ZANAFLEX) 4 MG tablet Take 4 mg by mouth 4 (four) times daily.     . traMADol (ULTRAM) 50 MG tablet Take 1 tablet (50 mg total) by mouth every 6 (six) hours as needed. (Patient taking differently: Take 50 mg by mouth every 6 (six) hours as needed (for pain.). ) 15 tablet 0  . traZODone (DESYREL) 50 MG tablet Take 50 mg by mouth at bedtime.   4   No facility-administered medications prior to visit.      Allergies:   Biaxin [clarithromycin]   Social History   Socioeconomic History  . Marital status: Divorced    Spouse name: Not on file  . Number of children: 4  . Years of education: Not on file  . Highest education level: Not on file  Occupational History  . 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 Financial resource strain: Not on file  . Food insecurity:    Worry: Not on file    Inability: Not on file  . Transportation needs:    Medical: Not on file    Non-medical: Not on file  Tobacco Use  . Smoking status: Former Smoker    Types: Cigarettes    Last attempt to quit: 10/14/1998    Years since quitting: 19.8  . Smokeless tobacco: Never Used  Substance and Sexual Activity  . Alcohol use: No  . Drug use: No  . Sexual activity: Not on file  Lifestyle  . Physical activity:    Days per week: Not on file    Minutes per session: Not on file  . Stress: Not on file  Relationships  . Social connections:    Talks on phone: Not on file    Gets  together: Not on file    Attends religious service: Not on file    Active member of club or organization: Not on file    Attends meetings of clubs or organizations: Not on file    Relationship status: Not on file  Other Topics Concern  . Not on file  Social History Narrative  .  Not on file     Family History:  The patient's family history includes Cancer in her unknown relative; Coronary artery disease in her unknown relative; Diabetes in her unknown relative; HIV in her unknown relative; Heart disease in her unknown relative; Hypertension in her unknown relative.   ROS:   Please see the history of present illness.    ROS All other systems reviewed and are negative.   PHYSICAL EXAM:   VS:  There were no vitals taken for this visit.   GEN: Well nourished, well developed, in no acute distress  HEENT: normal  Neck: no JVD, carotid bruits, or masses Cardiac: RRR; no murmurs, rubs, or gallops,no edema  Respiratory:  clear to auscultation bilaterally, normal work of breathing GI: soft, nontender, nondistended, + BS MS: no deformity or atrophy  Skin: warm and dry, no rash Neuro:  Alert and Oriented x 3, Strength and sensation are intact Psych: euthymic mood, full affect  Wt Readings from Last 3 Encounters:  01/06/18 213 lb (96.6 kg)  12/26/17 213 lb (96.6 kg)  12/23/17 213 lb (96.6 kg)      Studies/Labs Reviewed:   EKG:  EKG is ordered today.  The ekg ordered today demonstrates normal sinus rhythm, minimal T wave inversion in lead I and aVL.  Recent Labs: 12/26/2017: BUN 13; Creatinine, Ser 0.96; Hemoglobin 11.1; Platelets 208; Potassium 4.2; Sodium 139   Lipid Panel    Component Value Date/Time   CHOL 173 04/16/2012 0030   TRIG 200 (H) 04/16/2012 0030   HDL 63 04/16/2012 0030   CHOLHDL 2.7 04/16/2012 0030   VLDL 40 04/16/2012 0030   LDLCALC 70 04/16/2012 0030   Dated 08/03/18: Normal chemistries and CBC.   Additional studies/ records that were reviewed today include:     Echo 04/16/2012 LV EF: 50% -  55% Study Conclusions  - Left ventricle: The cavity size was normal. Wall thickness was increased in a pattern of mild LVH. Systolic function was normal. The estimated ejection fraction was in the range of 50% to 55%. Wall motion was normal; there were no regional wall motion abnormalities. Doppler parameters are consistent with abnormal left ventricular relaxation (grade 1 diastolic dysfunction). - Mitral valve: Mild regurgitation. - Pulmonary arteries: Systolic pressure was mildly increased. PA peak pressure: 46mm Hg (S).    Cath 04/17/2012 Hemodynamic Findings: Central aortic pressure: 141/83 Left ventricular pressure: 138/17/31  Angiographic Findings:  Left main: No obstructive disease.  Left Anterior Descending Artery: Large caliber vessel that courses to the apex. There are mild luminal irregularities but no focal stenosis.   Circumflex Artery: Moderate sized vessel that gives off three small to moderate sized marginal branches that are free of disease. There is a small caliber intermediate branch that appears to have 50% stenosis at the ostium. (This vessel is 1.5 mm.)   Right Coronary Artery: Small to moderate sized dominant vessel with mild luminal irregularities but no focal stenosis.   Left Ventricular Angiogram: LVEF=50%.  Impression: 1. Mild, non-obstructive CAD 2. Low normal LV systolic function 3. Non-cardiac chest pain    Myoview 11/09/2015 Study Highlights    The left ventricular ejection fraction is normal (55-65%).  Nuclear stress EF: 60%.  There was no ST segment deviation noted during stress.  The study is normal.  This is a low risk study.  Normal myocardial perfusion.   Normal myocardial perfusion and function; EF 60%.       ASSESSMENT:    No diagnosis found.   PLAN:  In order of problems listed above:  1. Hypertension: Blood pressure very well controlled on current  medication  2. Hyperlipidemia: On Zocor 20 mg daily.  Fasting lipid panel per primary care provider.  3. History of cardiomyopathy: However last Myoview in 2017 showed normal ejection fraction  4. Hypothyroidism: Managed by primary care provider    Medication Adjustments/Labs and Tests Ordered: Current medicines are reviewed at length with the patient today.  Concerns regarding medicines are outlined above.  Medication changes, Labs and Tests ordered today are listed in the Patient Instructions below. There are no Patient Instructions on file for this visit.   Signed, Carrah Eppolito Sw[place]and, MD  08/15/2018 9:20 PM    [facility] [address] [place], [place], [place]  [address] Phone: [phone]; Fax: [phone]

## 2018-08-18 ENCOUNTER — Ambulatory Visit: Payer: Medicare Other | Admitting: Cardiology

## 2018-08-19 ENCOUNTER — Encounter: Payer: Self-pay | Admitting: *Deleted

## 2018-11-06 ENCOUNTER — Other Ambulatory Visit (HOSPITAL_COMMUNITY): Payer: Self-pay | Admitting: Gastroenterology

## 2018-11-06 ENCOUNTER — Other Ambulatory Visit: Payer: Self-pay | Admitting: Gastroenterology

## 2018-11-06 DIAGNOSIS — R110 Nausea: Secondary | ICD-10-CM

## 2018-11-06 DIAGNOSIS — R1013 Epigastric pain: Secondary | ICD-10-CM

## 2018-11-13 ENCOUNTER — Ambulatory Visit (HOSPITAL_COMMUNITY)
Admission: RE | Admit: 2018-11-13 | Discharge: 2018-11-13 | Disposition: A | Discharge status: Home / Self Care | Payer: Medicare Other | Source: Ambulatory Visit | Attending: Gastroenterology | Admitting: Gastroenterology

## 2018-11-13 DIAGNOSIS — R110 Nausea: Secondary | ICD-10-CM | POA: Diagnosis not present

## 2018-11-13 DIAGNOSIS — R1013 Epigastric pain: Secondary | ICD-10-CM | POA: Diagnosis present

## 2018-11-13 MED ORDER — TECHNETIUM TC 99M SULFUR COLLOID
2.0000 | Freq: Once | INTRAVENOUS | Status: AC | PRN
  Administered 2018-11-13: 2 via ORAL

## 2019-04-21 ENCOUNTER — Other Ambulatory Visit: Payer: Self-pay | Admitting: Physician Assistant

## 2019-04-21 DIAGNOSIS — R1319 Other dysphagia: Secondary | ICD-10-CM

## 2019-04-21 DIAGNOSIS — R1310 Dysphagia, unspecified: Secondary | ICD-10-CM

## 2019-04-21 DIAGNOSIS — R634 Abnormal weight loss: Secondary | ICD-10-CM

## 2019-04-26 ENCOUNTER — Other Ambulatory Visit: Payer: Self-pay

## 2019-04-26 ENCOUNTER — Ambulatory Visit (HOSPITAL_COMMUNITY)
Admission: RE | Admit: 2019-04-26 | Discharge: 2019-04-26 | Disposition: A | Discharge status: Home / Self Care | Payer: Medicare Other | Source: Ambulatory Visit | Attending: Physician Assistant | Admitting: Physician Assistant

## 2019-04-26 DIAGNOSIS — R1319 Other dysphagia: Secondary | ICD-10-CM

## 2019-04-26 DIAGNOSIS — R1310 Dysphagia, unspecified: Secondary | ICD-10-CM | POA: Insufficient documentation

## 2019-04-26 DIAGNOSIS — R634 Abnormal weight loss: Secondary | ICD-10-CM | POA: Insufficient documentation

## 2019-05-29 DIAGNOSIS — D649 Anemia, unspecified: Secondary | ICD-10-CM | POA: Insufficient documentation

## 2019-05-29 DIAGNOSIS — R1310 Dysphagia, unspecified: Secondary | ICD-10-CM | POA: Insufficient documentation

## 2019-11-10 ENCOUNTER — Encounter: Payer: Self-pay | Admitting: Cardiology

## 2019-11-10 NOTE — Telephone Encounter (Signed)
error 

## 2020-09-17 DIAGNOSIS — H2511 Age-related nuclear cataract, right eye: Secondary | ICD-10-CM | POA: Insufficient documentation

## 2020-11-27 DIAGNOSIS — H2512 Age-related nuclear cataract, left eye: Secondary | ICD-10-CM | POA: Insufficient documentation

## 2021-03-06 DIAGNOSIS — I38 Endocarditis, valve unspecified: Secondary | ICD-10-CM | POA: Insufficient documentation

## 2021-03-06 DIAGNOSIS — Z91199 Patient's noncompliance with other medical treatment and regimen due to unspecified reason: Secondary | ICD-10-CM | POA: Insufficient documentation

## 2021-03-06 DIAGNOSIS — I25118 Atherosclerotic heart disease of native coronary artery with other forms of angina pectoris: Secondary | ICD-10-CM | POA: Insufficient documentation

## 2021-03-06 DIAGNOSIS — I509 Heart failure, unspecified: Secondary | ICD-10-CM | POA: Insufficient documentation

## 2021-03-23 DIAGNOSIS — E7841 Elevated Lipoprotein(a): Secondary | ICD-10-CM | POA: Insufficient documentation

## 2022-02-18 DIAGNOSIS — R160 Hepatomegaly, not elsewhere classified: Secondary | ICD-10-CM | POA: Insufficient documentation

## 2022-02-18 DIAGNOSIS — F419 Anxiety disorder, unspecified: Secondary | ICD-10-CM | POA: Insufficient documentation

## 2023-09-17 ENCOUNTER — Encounter (HOSPITAL_COMMUNITY): Payer: Self-pay

## 2023-09-17 ENCOUNTER — Ambulatory Visit (HOSPITAL_COMMUNITY)
Admission: EM | Admit: 2023-09-17 | Discharge: 2023-09-17 | Disposition: A | Discharge status: Home / Self Care | Payer: Medicare Other

## 2023-09-17 DIAGNOSIS — I10 Essential (primary) hypertension: Secondary | ICD-10-CM

## 2023-09-17 DIAGNOSIS — Z760 Encounter for issue of repeat prescription: Secondary | ICD-10-CM | POA: Diagnosis not present

## 2023-09-17 MED ORDER — AMLODIPINE BESYLATE 10 MG PO TABS: 10.0000 mg | ORAL_TABLET | Freq: Every day | ORAL | 0 refills | Status: DC

## 2023-09-17 NOTE — ED Provider Notes (Signed)
[facility]    CSN: [id number] Arrival date & time: 09/17/23  1128     History   Chief Complaint Chief Complaint  Patient presents with   Medication Refill   HPI Tracy Buckley is a 72 y.o. female.  Presents to urgent care for medication refills She recently moved to [facility] from [place] Has not taken her medications for several months  She does not know the medication names that she wants refilled.  Per chart review, she had amlodipine 10 mg filled on 8/29  Today denies headache, chest pain, shortness of breath, abdominal pain, weakness, numbness/tingling.   Medication reconciliation is difficult as patient not sure of names and what she's supposed to be taking  Past Medical History:  Diagnosis Date   Arthritis    Chronic chest pain    Depression    Admission for suicidal ideation 2001   Diverticulitis    Essential hypertension, benign    Fibromyalgia    Hypercholesteremia    Hypothyroidism    Myocardial infarction (HCC)    2002   Nonischemic cardiomyopathy (HCC)     No obstructive disease at catheterization 2002 - Dr. [name] and in July of 2013. EF is 50 to 55%. Grade 1 diastolic dysfunction noted on echo 04/2012   Obesity    Peripheral vascular disease (HCC)    Polysubstance abuse (HCC)     Remote history   Shortness of breath dyspnea with exertion    Patient Active Problem List   Diagnosis Date Noted   Primary osteoarthritis of knee 01/06/2018   Primary osteoarthritis of left knee 12/15/2017   Chronic pain 12/15/2017   Primary osteoarthritis of right knee 12/14/2015   S/P total knee arthroplasty 12/12/2015   Precordial pain 04/16/2012   Nonischemic cardiomyopathy (HCC) 04/16/2012   Essential hypertension, benign 04/16/2012   Mixed hyperlipidemia 04/16/2012    Past Surgical History:  Procedure Laterality Date   ABDOMINAL HYSTERECTOMY     BACK SURGERY     x2   CARDIAC CATHETERIZATION     CESAREAN SECTION     HAND SURGERY     x2    Hysterectomy - unknown type     KIDNEY STONE SURGERY     LEFT HEART CATHETERIZATION WITH CORONARY ANGIOGRAM N/A 04/17/2012   Procedure: LEFT HEART CATHETERIZATION WITH CORONARY ANGIOGRAM;  Surgeon: Kathleene Hazel, MD;  Location: [facility] CATH LAB;  Service: Cardiovascular;  Laterality: N/A;   PARTIAL KNEE ARTHROPLASTY Right 12/12/2015   Procedure: RIGHT UNICOMPARTMENTAL KNEE;  Surgeon: Sheral Apley, MD;  Location: [facility] OR;  Service: Orthopedics;  Laterality: Right;   PARTIAL KNEE ARTHROPLASTY Left 01/06/2018   Procedure: UNICOMPARTMENTAL KNEE;  Surgeon: Sheral Apley, MD;  Location: [facility] OR;  Service: Orthopedics;  Laterality: Left;   SHOULDER SURGERY     Tummy tuck  2009    OB History   No obstetric history on file.      Home Medications    Prior to Admission medications   Medication Sig Start Date End Date Taking? Authorizing Provider  amLODipine (NORVASC) 10 MG tablet Take 1 tablet (10 mg total) by mouth daily for 5 days. 09/17/23 09/22/23  Reganne Messerschmidt, Lurena Joiner, PA-C  aspirin EC 325 MG tablet Take 1 tablet (325 mg total) by mouth daily. For 30 days post op for DVT Prophylaxis 01/06/18   Albina Billet III, PA-C  atorvastatin (LIPITOR) 80 MG tablet Take 80 mg by mouth at bedtime.    [provider]  carvedilol (COREG) 3.125  MG tablet Take 3.125 mg by mouth 2 (two) times daily with a meal.    [provider]  diazepam (VALIUM) 5 MG tablet Take 5 mg by mouth 2 (two) times daily.  09/29/15   [provider]  docusate sodium (COLACE) 100 MG capsule Take 1 capsule (100 mg total) by mouth 2 (two) times daily. To prevent constipation while taking pain medication. 01/06/18   Albina Billet III, PA-C  levothyroxine (SYNTHROID, LEVOTHROID) 75 MCG tablet Take 75 mcg by mouth daily before breakfast.  12/05/15   [provider]  morphine (MSIR) 15 MG tablet Take 15 mg by mouth 2 (two) times daily as needed for severe pain (for pain.).    [provider]  nitroGLYCERIN (NITROSTAT) 0.4 MG SL tablet Place 1 tablet (0.4 mg total) under the tongue every 5 (five) minutes as needed. Patient taking differently: Place 0.4 mg under the tongue every 5 (five) minutes as needed for chest pain.  05/01/12   Rosalio Macadamia, NP  omeprazole (PRILOSEC) 20 MG capsule Take 1 capsule (20 mg total) by mouth daily. 30 days for gastroprotection while taking NSAIDs. 01/06/18   Albina Billet III, PA-C  ondansetron (ZOFRAN) 4 MG tablet Take 1 tablet (4 mg total) by mouth every 8 (eight) hours as needed for nausea or vomiting. 12/12/15   Janalee Dane, PA-C  oxycodone (ROXICODONE) 30 MG immediate release tablet Take 30 mg by mouth 3 (three) times daily. 11/24/17   [provider]  polyethylene glycol powder (GLYCOLAX/MIRALAX) powder Take 17 g by mouth daily.    [provider]  pregabalin (LYRICA) 150 MG capsule Take 150 mg by mouth at bedtime.    [provider]  ramipril (ALTACE) 10 MG capsule Take 10 mg by mouth daily. 10/05/15   [provider]  simvastatin (ZOCOR) 20 MG tablet Take 20 mg by mouth at bedtime.     [provider]  tiZANidine (ZANAFLEX) 4 MG tablet Take 4 mg by mouth 4 (four) times daily.     [provider]  traMADol (ULTRAM) 50 MG tablet Take 1 tablet (50 mg total) by mouth every 6 (six) hours as needed. Patient taking differently: Take 50 mg by mouth every 6 (six) hours as needed (for pain.).  01/17/17   Cathren Laine, MD  traZODone (DESYREL) 50 MG tablet Take 50 mg by mouth at bedtime.  09/15/15   [provider]    Family History Family History  Problem Relation Age of Onset   Heart disease Other    Cancer Other    HIV Other    Coronary artery disease Other    Hypertension Other    Diabetes Other     Social History Social History   Tobacco Use   Smoking status: Former    Current packs/day: 0.00    Types: Cigarettes    Quit date: 10/14/1998    Years since  quitting: 24.9   Smokeless tobacco: Never  Vaping Use   Vaping status: Never Used  Substance Use Topics   Alcohol use: No   Drug use: No     Allergies   Biaxin [clarithromycin] and Sulfamethoxazole-trimethoprim   Review of Systems Review of Systems Per HPI  Physical Exam Triage Vital Signs ED Triage Vitals [09/17/23 1149]  Encounter Vitals Group     BP      Systolic BP Percentile      Diastolic BP Percentile      Pulse  Resp      Temp      Temp src      SpO2      Weight      Height 5\' 2"  (1.575 m)     Head Circumference      Peak Flow      Pain Score 0     Pain Loc      Pain Education      Exclude from Growth Chart    No data found.  Updated Vital Signs BP (!) 149/91 (BP Location: Right Arm)   Pulse 97   Temp 98.1 F (36.7 C) (Oral)   Resp 18   Ht 5\' 2"  (1.575 m)   SpO2 96%   BMI 38.96 kg/m    Physical Exam Vitals and nursing note reviewed.  Constitutional:      General: She is not in acute distress.    Appearance: Normal appearance.  Cardiovascular:     Rate and Rhythm: Normal rate and regular rhythm.     Pulses: Normal pulses.     Heart sounds: Normal heart sounds.  Pulmonary:     Effort: Pulmonary effort is normal.     Breath sounds: Normal breath sounds.  Neurological:     Mental Status: She is alert and oriented to person, place, and time.     UC Treatments / Results  Labs (all labs ordered are listed, but only abnormal results are displayed) Labs Reviewed - No data to display  EKG  Radiology No results found.  Procedures Procedures   Medications Ordered in UC Medications - No data to display  Initial Impression / Assessment and Plan / UC Course  I have reviewed the triage vital signs and the nursing notes.  Pertinent labs & imaging results that were available during my care of the patient were reviewed by me and considered in my medical decision making (see chart for details).  I have scheduled patient with a primary  care provider for 5 days from now on 12/9 In the meantime will refill her amlodipine 10 mg to take daily Advised all other medications will need to be reviewed and managed by her new PCP BP recheck improved, 149/91  Final Clinical Impressions(s) / UC Diagnoses   Final diagnoses:  Hypertension, unspecified type  Medication refill     Discharge Instructions      Take amlodipine 10mg  once daily for blood pressure You have 6 pills to take daily until your primary care appointment that's on Monday December 9th     ED Prescriptions     Medication Sig Dispense Auth. Provider   amLODipine (NORVASC) 10 MG tablet Take 1 tablet (10 mg total) by mouth daily for 5 days. 6 tablet Esley Brooking, Lurena Joiner, PA-C      I have reviewed the PDMP during this encounter.   Demetrio Leighty, Lurena Joiner, [place] 09/17/23 1306

## 2023-09-17 NOTE — ED Triage Notes (Signed)
Pt presents to urgent care for medication refill. Pt reports recent move to [place] from [place]. Pt states she has not taken her daily prescribed medications "for months." Only symptom pt reports is fatigue but believes this is from not taking her medications.

## 2023-09-17 NOTE — ED Notes (Addendum)
Pt has recently moved here from [place]. Pt is unsure of the medications she was taking. This RN attempted to update pt's medication list however pt keeps stating "it is updated in the system. I am not sure of the medication names I was prescribed." PA made aware.

## 2023-09-17 NOTE — Discharge Instructions (Addendum)
Take amlodipine 10mg  once daily for blood pressure You have 6 pills to take daily until your primary care appointment that's on Monday December 9th

## 2023-09-22 ENCOUNTER — Ambulatory Visit: Payer: Medicare Other | Admitting: Family Medicine

## 2023-09-22 NOTE — Progress Notes (Unsigned)
New Patient Office Visit  Subjective    Patient ID: Tracy Buckley, female    DOB: Mar 15, 1951  Age: 72 y.o. MRN: [id number]  CC: No chief complaint on file.   HPI Amyre R Monnot presents to establish care ***  Medications: Amlodipine, atorvastatin, Zetia, ramipril,  PMH: Thyroid disease, hyperlipidemia, hypertension,  PSH: ***  FH: ***  Tobacco use: *** Alcohol use: *** Drug use: *** Marital status: *** Employment: *** Sexual hx: ***  Screenings:  Colon Cancer: *** Lung Cancer: *** Breast Cancer: *** Diabetes: *** HLD: ***   Outpatient Encounter Medications as of 09/22/2023  Medication Sig   amLODipine (NORVASC) 10 MG tablet Take 1 tablet (10 mg total) by mouth daily for 5 days.   aspirin EC 325 MG tablet Take 1 tablet (325 mg total) by mouth daily. For 30 days post op for DVT Prophylaxis   atorvastatin (LIPITOR) 80 MG tablet Take 80 mg by mouth at bedtime.   carvedilol (COREG) 3.125 MG tablet Take 3.125 mg by mouth 2 (two) times daily with a meal.   diazepam (VALIUM) 5 MG tablet Take 5 mg by mouth 2 (two) times daily.    docusate sodium (COLACE) 100 MG capsule Take 1 capsule (100 mg total) by mouth 2 (two) times daily. To prevent constipation while taking pain medication.   levothyroxine (SYNTHROID, LEVOTHROID) 75 MCG tablet Take 75 mcg by mouth daily before breakfast.    morphine (MSIR) 15 MG tablet Take 15 mg by mouth 2 (two) times daily as needed for severe pain (for pain.).   nitroGLYCERIN (NITROSTAT) 0.4 MG SL tablet Place 1 tablet (0.4 mg total) under the tongue every 5 (five) minutes as needed. (Patient taking differently: Place 0.4 mg under the tongue every 5 (five) minutes as needed for chest pain. )   omeprazole (PRILOSEC) 20 MG capsule Take 1 capsule (20 mg total) by mouth daily. 30 days for gastroprotection while taking NSAIDs.   ondansetron (ZOFRAN) 4 MG tablet Take 1 tablet (4 mg total) by mouth every 8 (eight) hours as needed for nausea or vomiting.    oxycodone (ROXICODONE) 30 MG immediate release tablet Take 30 mg by mouth 3 (three) times daily.   polyethylene glycol powder (GLYCOLAX/MIRALAX) powder Take 17 g by mouth daily.   pregabalin (LYRICA) 150 MG capsule Take 150 mg by mouth at bedtime.   ramipril (ALTACE) 10 MG capsule Take 10 mg by mouth daily.   simvastatin (ZOCOR) 20 MG tablet Take 20 mg by mouth at bedtime.    tiZANidine (ZANAFLEX) 4 MG tablet Take 4 mg by mouth 4 (four) times daily.    traMADol (ULTRAM) 50 MG tablet Take 1 tablet (50 mg total) by mouth every 6 (six) hours as needed. (Patient taking differently: Take 50 mg by mouth every 6 (six) hours as needed (for pain.). )   traZODone (DESYREL) 50 MG tablet Take 50 mg by mouth at bedtime.    No facility-administered encounter medications on file as of 09/22/2023.    Past Medical History:  Diagnosis Date   Arthritis    Chronic chest pain    Depression    Admission for suicidal ideation 2001   Diverticulitis    Essential hypertension, benign    Fibromyalgia    Hypercholesteremia    Hypothyroidism    Myocardial infarction [facility])    2002   Nonischemic cardiomyopathy (HCC)     No obstructive disease at catheterization 2002 - Dr. [name] and in July of 2013. EF is 50  to 55%. Grade 1 diastolic dysfunction noted on echo 04/2012   Obesity    Peripheral vascular disease (HCC)    Polysubstance abuse (HCC)     Remote history   Shortness of breath dyspnea with exertion    Past Surgical History:  Procedure Laterality Date   ABDOMINAL HYSTERECTOMY     BACK SURGERY     x2   CARDIAC CATHETERIZATION     CESAREAN SECTION     HAND SURGERY     x2   Hysterectomy - unknown type     KIDNEY STONE SURGERY     LEFT HEART CATHETERIZATION WITH CORONARY ANGIOGRAM N/A 04/17/2012   Procedure: LEFT HEART CATHETERIZATION WITH CORONARY ANGIOGRAM;  Surgeon: Kathleene Hazel, MD;  Location: [facility] CATH LAB;  Service: Cardiovascular;  Laterality: N/A;   PARTIAL KNEE ARTHROPLASTY Right  12/12/2015   Procedure: RIGHT UNICOMPARTMENTAL KNEE;  Surgeon: Sheral Apley, MD;  Location: [facility] OR;  Service: Orthopedics;  Laterality: Right;   PARTIAL KNEE ARTHROPLASTY Left 01/06/2018   Procedure: UNICOMPARTMENTAL KNEE;  Surgeon: Sheral Apley, MD;  Location: [facility] OR;  Service: Orthopedics;  Laterality: Left;   SHOULDER SURGERY     Tummy tuck  2009    Family History  Problem Relation Age of Onset   Heart disease Other    Cancer Other    HIV Other    Coronary artery disease Other    Hypertension Other    Diabetes Other     Social History   Socioeconomic History   Marital status: Divorced    Spouse name: Not on file   Number of children: 4   Years of education: Not on file   Highest education level: Not on file  Occupational History   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  Tobacco Use   Smoking status: Former    Current packs/day: 0.00    Types: Cigarettes    Quit date: 10/14/1998    Years since quitting: 24.9   Smokeless tobacco: Never  Vaping Use   Vaping status: Never Used  Substance and Sexual Activity   Alcohol use: No   Drug use: No   Sexual activity: Not on file  Other Topics Concern   Not on file  Social History Narrative   Not on file   Social Determinants of Health   Financial Resource Strain: Not on file  Food Insecurity: Not on file  Transportation Needs: Not on file  Physical Activity: Not on file  Stress: No Stress Concern Present (11/24/2020)   Received from [facility] - Occupational Stress Questionnaire    Feeling of Stress : Not at all  Social Connections: Unknown (02/26/2022)   Received from [facility]   Social Network    Social Network: Not on file  Intimate Partner Violence: Unknown (01/18/2022)   Received from [facility]   HITS    Physically Hurt: Not on file    Insult or Talk Down To: Not on file    Threaten Physical Harm: Not on file    Scream or Curse:  Not on file    ROS      Objective    There were no vitals taken for this visit.  Physical Exam     Assessment & Plan:   There are no diagnoses linked to this encounter.  No follow-ups on file.   Sandre Kitty, MD

## 2023-09-23 ENCOUNTER — Ambulatory Visit (INDEPENDENT_AMBULATORY_CARE_PROVIDER_SITE_OTHER): Payer: Medicare Other | Admitting: Family Medicine

## 2023-09-23 ENCOUNTER — Encounter: Payer: Self-pay | Admitting: Family Medicine

## 2023-09-23 VITALS — BP 146/92 | HR 86 | Temp 97.6°F | Resp 18 | Ht 62.0 in | Wt 192.0 lb

## 2023-09-23 DIAGNOSIS — F32A Depression, unspecified: Secondary | ICD-10-CM | POA: Insufficient documentation

## 2023-09-23 DIAGNOSIS — E039 Hypothyroidism, unspecified: Secondary | ICD-10-CM | POA: Diagnosis not present

## 2023-09-23 DIAGNOSIS — R3589 Other polyuria: Secondary | ICD-10-CM

## 2023-09-23 DIAGNOSIS — Z7689 Persons encountering health services in other specified circumstances: Secondary | ICD-10-CM

## 2023-09-23 DIAGNOSIS — R5382 Chronic fatigue, unspecified: Secondary | ICD-10-CM

## 2023-09-23 DIAGNOSIS — I428 Other cardiomyopathies: Secondary | ICD-10-CM

## 2023-09-23 DIAGNOSIS — I10 Essential (primary) hypertension: Secondary | ICD-10-CM | POA: Diagnosis not present

## 2023-09-23 MED ORDER — RAMIPRIL 10 MG PO CAPS: 10.0000 mg | ORAL_CAPSULE | Freq: Every day | ORAL | 0 refills | Status: DC

## 2023-09-23 MED ORDER — EZETIMIBE 10 MG PO TABS: 10.0000 mg | ORAL_TABLET | Freq: Every day | ORAL | 0 refills | Status: DC

## 2023-09-23 MED ORDER — ATORVASTATIN CALCIUM 80 MG PO TABS: 80.0000 mg | ORAL_TABLET | Freq: Every day | ORAL | 0 refills | Status: DC

## 2023-09-23 MED ORDER — LEVOTHYROXINE SODIUM 75 MCG PO TABS: 75.0000 ug | ORAL_TABLET | Freq: Every day | ORAL | 0 refills | Status: DC

## 2023-09-23 MED ORDER — AMLODIPINE BESYLATE 10 MG PO TABS: 10.0000 mg | ORAL_TABLET | Freq: Every day | ORAL | 0 refills | Status: DC

## 2023-09-23 MED ORDER — CARVEDILOL 3.125 MG PO TABS: 3.1250 mg | ORAL_TABLET | Freq: Two times a day (BID) | ORAL | 0 refills | Status: DC

## 2023-09-23 NOTE — Progress Notes (Signed)
New Patient Office Visit  Subjective    Patient ID: Tracy Buckley, female    DOB: [birth year]  Age: 72 y.o. MRN: [id number]  CC:  Chief Complaint  Patient presents with   Establish Care    Patient is here to establish care with a new PCP , Patient is needing refills on her medication    HPI Tracy Buckley presents to establish care. Pt is new to me.  Recently moved from [place], [place]. She reports her last visit with PCP was in June 2024.   She reports hx of CHF and HTN. She is taking Ramipril 10mg , Amlodipine 10mg , and Coreg 3.125mg  bid. She is needing refills on this today. She has HLD and taking Zetia 10mg  and Atorvastatin 80mg  daily. She has hypothyroidism and using Synthroid 75mg  daily. She needs this refilled. She has had fatigue for the last few weeks.  She also reports hx of stomach issues along with IBS and diverticulitis. She has a GI provider that she recently seen and had EGD done a few months ago. This was normal.  Reviewed today in care everywhere. She reports she has nausea in the morning. She also deals with polyuria.  She denies hx of diabetes but has been many years since her last A1c check.  Outpatient Encounter Medications as of 09/23/2023  Medication Sig   aspirin EC 325 MG tablet Take 1 tablet (325 mg total) by mouth daily. For 30 days post op for DVT Prophylaxis   carvedilol (COREG) 3.125 MG tablet Take 3.125 mg by mouth 2 (two) times daily with a meal.   ramipril (ALTACE) 10 MG capsule Take 10 mg by mouth daily.   amLODipine (NORVASC) 10 MG tablet Take 1 tablet (10 mg total) by mouth daily for 5 days.   atorvastatin (LIPITOR) 80 MG tablet Take 80 mg by mouth at bedtime. (Patient not taking: Reported on 09/23/2023)   ezetimibe (ZETIA) 10 MG tablet Take 1 tablet by mouth daily. (Patient not taking: Reported on 09/23/2023)   levothyroxine (SYNTHROID, LEVOTHROID) 75 MCG tablet Take 75 mcg by mouth daily before breakfast.  (Patient not taking: Reported on  09/23/2023)   [DISCONTINUED] diazepam (VALIUM) 5 MG tablet Take 5 mg by mouth 2 (two) times daily.  (Patient not taking: Reported on 09/23/2023)   [DISCONTINUED] docusate sodium (COLACE) 100 MG capsule Take 1 capsule (100 mg total) by mouth 2 (two) times daily. To prevent constipation while taking pain medication.   [DISCONTINUED] morphine (MSIR) 15 MG tablet Take 15 mg by mouth 2 (two) times daily as needed for severe pain (for pain.).   [DISCONTINUED] nitroGLYCERIN (NITROSTAT) 0.4 MG SL tablet Place 1 tablet (0.4 mg total) under the tongue every 5 (five) minutes as needed. (Patient taking differently: Place 0.4 mg under the tongue every 5 (five) minutes as needed for chest pain. )   [DISCONTINUED] omeprazole (PRILOSEC) 20 MG capsule Take 1 capsule (20 mg total) by mouth daily. 30 days for gastroprotection while taking NSAIDs.   [DISCONTINUED] ondansetron (ZOFRAN) 4 MG tablet Take 1 tablet (4 mg total) by mouth every 8 (eight) hours as needed for nausea or vomiting.   [DISCONTINUED] oxycodone (ROXICODONE) 30 MG immediate release tablet Take 30 mg by mouth 3 (three) times daily.   [DISCONTINUED] pantoprazole (PROTONIX) 40 MG tablet Take 40 mg by mouth daily. (Patient not taking: Reported on 09/23/2023)   [DISCONTINUED] polyethylene glycol powder (GLYCOLAX/MIRALAX) powder Take 17 g by mouth daily. (Patient not taking: Reported on 09/23/2023)   [DISCONTINUED]  pregabalin (LYRICA) 150 MG capsule Take 150 mg by mouth at bedtime. (Patient not taking: Reported on 09/23/2023)   [DISCONTINUED] simvastatin (ZOCOR) 20 MG tablet Take 20 mg by mouth at bedtime.    [DISCONTINUED] tiZANidine (ZANAFLEX) 4 MG tablet Take 4 mg by mouth 4 (four) times daily.    [DISCONTINUED] traMADol (ULTRAM) 50 MG tablet Take 1 tablet (50 mg total) by mouth every 6 (six) hours as needed. (Patient taking differently: Take 50 mg by mouth every 6 (six) hours as needed (for pain.). )   [DISCONTINUED] traZODone (DESYREL) 50 MG tablet Take  50 mg by mouth at bedtime.    No facility-administered encounter medications on file as of 09/23/2023.    Past Medical History:  Diagnosis Date   Arthritis    Chronic chest pain    Depression    Admission for suicidal ideation 2001   Diverticulitis    Essential hypertension, benign    Fibromyalgia    Hypercholesteremia    Hypothyroidism    Myocardial infarction [facility])    2002   Nonischemic cardiomyopathy (HCC)     No obstructive disease at catheterization 2002 - Dr. [name] and in July of 2013. EF is 50 to 55%. Grade 1 diastolic dysfunction noted on echo 04/2012   Obesity    Peripheral vascular disease (HCC)    Polysubstance abuse (HCC)     Remote history   Shortness of breath dyspnea with exertion    Past Surgical History:  Procedure Laterality Date   ABDOMINAL HYSTERECTOMY     BACK SURGERY     x2   CARDIAC CATHETERIZATION     CESAREAN SECTION     HAND SURGERY     x2   Hysterectomy - unknown type     KIDNEY STONE SURGERY     LEFT HEART CATHETERIZATION WITH CORONARY ANGIOGRAM N/A 04/17/2012   Procedure: LEFT HEART CATHETERIZATION WITH CORONARY ANGIOGRAM;  Surgeon: Kathleene Hazel, MD;  Location: [facility] CATH LAB;  Service: Cardiovascular;  Laterality: N/A;   PARTIAL KNEE ARTHROPLASTY Right 12/12/2015   Procedure: RIGHT UNICOMPARTMENTAL KNEE;  Surgeon: Sheral Apley, MD;  Location: [facility] OR;  Service: Orthopedics;  Laterality: Right;   PARTIAL KNEE ARTHROPLASTY Left 01/06/2018   Procedure: UNICOMPARTMENTAL KNEE;  Surgeon: Sheral Apley, MD;  Location: [facility] OR;  Service: Orthopedics;  Laterality: Left;   SHOULDER SURGERY     Tummy tuck  2009    Family History  Problem Relation Age of Onset   Heart disease Other    Cancer Other    HIV Other    Coronary artery disease Other    Hypertension Other    Diabetes Other     Social History   Socioeconomic History   Marital status: Divorced    Spouse name: Not on file   Number of children: 4   Years of education: Not  on file   Highest education level: Not on file  Occupational History   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  Tobacco Use   Smoking status: Former    Current packs/day: 0.00    Types: Cigarettes    Quit date: 10/14/1998    Years since quitting: 24.9    Passive exposure: Past   Smokeless tobacco: Never  Vaping Use   Vaping status: Never Used  Substance and Sexual Activity   Alcohol use: No   Drug use: No   Sexual activity: Not Currently  Other Topics Concern   Not on file  Social History Narrative   Not on file   Social Determinants of Health   Financial Resource Strain: Not on file  Food Insecurity: Not on file  Transportation Needs: Not on file  Physical Activity: Not on file  Stress: No Stress Concern Present (11/24/2020)   Received from [facility] - Occupational Stress Questionnaire    Feeling of Stress : Not at all  Social Connections: Unknown (02/26/2022)   Received from [facility]   Social Network    Social Network: Not on file  Intimate Partner Violence: Unknown (01/18/2022)   Received from [facility]   HITS    Physically Hurt: Not on file    Insult or Talk Down To: Not on file    Threaten Physical Harm: Not on file    Scream or Curse: Not on file    Review of Systems  Constitutional:  Positive for malaise/fatigue.  Genitourinary:  Positive for frequency.       POLYURIA  All other systems reviewed and are negative.      Objective    BP (!) 146/92   Pulse 86   Temp 97.6 F (36.4 C) (Oral)   Resp 18   Ht 5\' 2"  (1.575 m)   Wt 192 lb (87.1 kg)   SpO2 99%   BMI 35.12 kg/m   Physical Exam Vitals and nursing note reviewed.  Constitutional:      Appearance: Normal appearance.  HENT:     Head: Normocephalic and atraumatic.     Right Ear: External ear normal.     Left Ear: External ear normal.     Nose: Nose normal.     Mouth/Throat:     Mouth: Mucous membranes are moist.      Pharynx: Oropharynx is clear.  Eyes:     Conjunctiva/sclera: Conjunctivae normal.     Pupils: Pupils are equal, round, and reactive to light.  Cardiovascular:     Rate and Rhythm: Normal rate and regular rhythm.     Pulses: Normal pulses.     Heart sounds: Normal heart sounds.  Pulmonary:     Effort: Pulmonary effort is normal.     Breath sounds: Normal breath sounds.  Skin:    General: Skin is warm.     Capillary Refill: Capillary refill takes less than 2 seconds.  Neurological:     General: No focal deficit present.     Mental Status: She is alert and oriented to person, place, and time. Mental status is at baseline.  Psychiatric:        Mood and Affect: Mood normal.        Behavior: Behavior normal.        Thought Content: Thought content normal.        Judgment: Judgment normal.       Assessment & Plan:   Problem List Items Addressed This Visit   None Encounter to establish care with new doctor  Nonischemic cardiomyopathy (HCC) -     Ramipril; Take 1 capsule (10 mg total) by mouth daily.  Dispense: 90 capsule; Refill: 0 -     Atorvastatin Calcium; Take 1 tablet (80 mg total) by mouth at bedtime.  Dispense: 90 tablet; Refill: 0 -     Ezetimibe; Take 1 tablet (10 mg total) by mouth daily.  Dispense: 90 tablet; Refill: 0 -     amLODIPine Besylate; Take 1 tablet (10 mg total) by mouth daily.  Dispense: 90 tablet; Refill: 0 -  Carvedilol; Take 1 tablet (3.125 mg total) by mouth 2 (two) times daily with a meal.  Dispense: 180 tablet; Refill: 0  Essential hypertension, benign -     Ramipril; Take 1 capsule (10 mg total) by mouth daily.  Dispense: 90 capsule; Refill: 0 -     amLODIPine Besylate; Take 1 tablet (10 mg total) by mouth daily.  Dispense: 90 tablet; Refill: 0 -     Carvedilol; Take 1 tablet (3.125 mg total) by mouth 2 (two) times daily with a meal.  Dispense: 180 tablet; Refill: 0  Acquired hypothyroidism -     Levothyroxine Sodium; Take 1 tablet (75 mcg  total) by mouth daily before breakfast.  Dispense: 90 tablet; Refill: 0 -     TSH -     T4, free  Chronic fatigue -     CBC with Differential/Platelet -     Comprehensive metabolic panel -     TSH -     T4, free  Polyuria -     Hemoglobin A1c   Pt with multiple medical conditions. Reviewed today. Lost to follow up. She reports she will reach out to her cardiologist in [place] to re-establish since her move back to town from [place]. Refilled all chronic medicines for 3 months. To recheck thyroid function today due to fatigue along with monitoring labs for medications including CBC, CMP.  Due to polyuria and no dx of diabetes, recheck A1c today. See back in 4 weeks for chronic condition follow up.   No follow-ups on file.   Suzan Slick, MD

## 2023-09-24 ENCOUNTER — Encounter: Payer: Self-pay | Admitting: Family Medicine

## 2023-09-24 LAB — COMPREHENSIVE METABOLIC PANEL
ALT: 25 [IU]/L (ref 0–32)
AST: 36 [IU]/L (ref 0–40)
Albumin: 4.9 g/dL — ABNORMAL HIGH (ref 3.8–4.8)
Alkaline Phosphatase: 69 [IU]/L (ref 44–121)
BUN/Creatinine Ratio: 13 (ref 12–28)
BUN: 12 mg/dL (ref 8–27)
Bilirubin Total: 0.5 mg/dL (ref 0.0–1.2)
CO2: 24 mmol/L (ref 20–29)
Calcium: 10.1 mg/dL (ref 8.7–10.3)
Chloride: 101 mmol/L (ref 96–106)
Creatinine, Ser: 0.91 mg/dL (ref 0.57–1.00)
Globulin, Total: 3.2 g/dL (ref 1.5–4.5)
Glucose: 101 mg/dL — ABNORMAL HIGH (ref 70–99)
Potassium: 4.5 mmol/L (ref 3.5–5.2)
Sodium: 141 mmol/L (ref 134–144)
Total Protein: 8.1 g/dL (ref 6.0–8.5)
eGFR: 67 mL/min/{1.73_m2} (ref >59)

## 2023-09-24 LAB — CBC WITH DIFFERENTIAL/PLATELET
Basophils Absolute: 0 10*3/uL (ref 0.0–0.2)
Basos: 1 %
EOS (ABSOLUTE): 0.2 10*3/uL (ref 0.0–0.4)
Eos: 5 %
Hematocrit: 41 % (ref 34.0–46.6)
Hemoglobin: 13.3 g/dL (ref 11.1–15.9)
Immature Grans (Abs): 0 10*3/uL (ref 0.0–0.1)
Immature Granulocytes: 1 %
Lymphocytes Absolute: 1.8 10*3/uL (ref 0.7–3.1)
Lymphs: 37 %
MCH: 34.5 pg — ABNORMAL HIGH (ref 26.6–33.0)
MCHC: 32.4 g/dL (ref 31.5–35.7)
MCV: 106 fL — ABNORMAL HIGH (ref 79–97)
Monocytes Absolute: 0.4 10*3/uL (ref 0.1–0.9)
Monocytes: 9 %
Neutrophils Absolute: 2.3 10*3/uL (ref 1.4–7.0)
Neutrophils: 47 %
Platelets: 255 10*3/uL (ref 150–450)
RBC: 3.86 x10E6/uL (ref 3.77–5.28)
RDW: 11.8 % (ref 11.7–15.4)
WBC: 4.8 10*3/uL (ref 3.4–10.8)

## 2023-09-24 LAB — T4, FREE: Free T4: 0.11 ng/dL — ABNORMAL LOW (ref 0.82–1.77)

## 2023-09-24 LAB — HEMOGLOBIN A1C
Est. average glucose Bld gHb Est-mCnc: 111 mg/dL
Hgb A1c MFr Bld: 5.5 % (ref 4.8–5.6)

## 2023-09-24 LAB — TSH: TSH: 89.4 u[IU]/mL — ABNORMAL HIGH (ref 0.450–4.500)

## 2023-09-29 ENCOUNTER — Telehealth: Payer: Self-pay

## 2023-09-29 NOTE — Telephone Encounter (Signed)
LVM for patient to return call at earliest convenience    RE:  Left message for patient to return call to discuss results

## 2023-12-19 ENCOUNTER — Other Ambulatory Visit: Payer: Self-pay | Admitting: Family Medicine

## 2023-12-19 DIAGNOSIS — I428 Other cardiomyopathies: Secondary | ICD-10-CM

## 2023-12-19 DIAGNOSIS — I10 Essential (primary) hypertension: Secondary | ICD-10-CM

## 2024-02-25 ENCOUNTER — Encounter: Payer: Self-pay | Admitting: Family Medicine

## 2024-02-25 ENCOUNTER — Ambulatory Visit (INDEPENDENT_AMBULATORY_CARE_PROVIDER_SITE_OTHER): Admitting: Family Medicine

## 2024-02-25 VITALS — BP 132/83 | HR 86 | Temp 98.1°F | Resp 18 | Ht 62.0 in | Wt 189.6 lb

## 2024-02-25 DIAGNOSIS — I10 Essential (primary) hypertension: Secondary | ICD-10-CM | POA: Diagnosis not present

## 2024-02-25 DIAGNOSIS — I428 Other cardiomyopathies: Secondary | ICD-10-CM | POA: Diagnosis not present

## 2024-02-25 DIAGNOSIS — E039 Hypothyroidism, unspecified: Secondary | ICD-10-CM

## 2024-02-25 MED ORDER — RAMIPRIL 10 MG PO CAPS: 10.0000 mg | ORAL_CAPSULE | Freq: Every day | ORAL | 0 refills | Status: AC

## 2024-02-25 MED ORDER — AMLODIPINE BESYLATE 10 MG PO TABS: 10.0000 mg | ORAL_TABLET | Freq: Every day | ORAL | 0 refills | Status: AC

## 2024-02-25 MED ORDER — LEVOTHYROXINE SODIUM 125 MCG PO TABS: 125.0000 ug | ORAL_TABLET | Freq: Every day | ORAL | 0 refills | Status: DC

## 2024-02-25 MED ORDER — EZETIMIBE 10 MG PO TABS: 10.0000 mg | ORAL_TABLET | Freq: Every day | ORAL | 0 refills | Status: AC

## 2024-02-25 MED ORDER — ATORVASTATIN CALCIUM 80 MG PO TABS: 80.0000 mg | ORAL_TABLET | Freq: Every day | ORAL | 0 refills | Status: AC

## 2024-02-25 MED ORDER — CARVEDILOL 3.125 MG PO TABS: 3.1250 mg | ORAL_TABLET | Freq: Two times a day (BID) | ORAL | 0 refills | Status: AC

## 2024-02-25 NOTE — Progress Notes (Signed)
 Established Patient Office Visit  Subjective   Patient ID: Tracy Buckley, female    DOB: 1951-07-07  Age: 73 y.o. MRN: [id number]  Chief Complaint  Patient presents with   Follow-up    Patient states that she would like to discuss feeling more fatigue than usual, she states that     HPI  Hypothyroidism Pt is here for follow up. She established care with me in December. I checked her thyroid and her TSH was 89. She was taking Synthroid  75mcg daily and I advised her to start taking 2 of them. Pt is here today and never read the [employer]. She has extreme fatigue.   Pt also reports she has chest tightness and feels like she needs to take her bra off. She has CHF and HTN. She had cardiologist in [place]. She is taking Amlodipine  10mg , Coreg  3.125mg  BID, and Ramipril  10mg  daily. She has a cardiologist with [facility]. She hasn't seen them since she has been back from [place].   Review of Systems  Constitutional:  Positive for malaise/fatigue.  Cardiovascular:  Positive for chest pain.  All other systems reviewed and are negative.     Objective:     BP 132/83   Pulse 86   Temp 98.1 F (36.7 C) (Oral)   Resp 18   Ht 5\' 2"  (1.575 m)   Wt 189 lb 10.1 oz (86 kg)   SpO2 98%   BMI 34.68 kg/m  BP Readings from Last 3 Encounters:  02/25/24 132/83  09/23/23 (!) 146/92  09/17/23 (!) 149/91      Physical Exam Vitals and nursing note reviewed.  Constitutional:      Appearance: Normal appearance. She is normal weight.  HENT:     Head: Normocephalic and atraumatic.     Right Ear: External ear normal.     Left Ear: External ear normal.     Nose: Nose normal.     Mouth/Throat:     Mouth: Mucous membranes are moist.     Pharynx: Oropharynx is clear.  Eyes:     Conjunctiva/sclera: Conjunctivae normal.     Pupils: Pupils are equal, round, and reactive to light.  Cardiovascular:     Rate and Rhythm: Normal rate and regular rhythm.     Pulses: Normal pulses.      Heart sounds: Normal heart sounds.  Pulmonary:     Effort: Pulmonary effort is normal.     Breath sounds: Normal breath sounds.  Skin:    General: Skin is warm.     Capillary Refill: Capillary refill takes less than 2 seconds.  Neurological:     General: No focal deficit present.     Mental Status: She is alert and oriented to person, place, and time. Mental status is at baseline.  Psychiatric:        Mood and Affect: Mood normal.        Behavior: Behavior normal.        Thought Content: Thought content normal.        Judgment: Judgment normal.     No results found for any visits on 02/25/24.  Last metabolic panel Lab Results  Component Value Date   GLUCOSE 101 (H) 09/23/2023   NA 141 09/23/2023   K 4.5 09/23/2023   CL 101 09/23/2023   CO2 24 09/23/2023   BUN 12 09/23/2023   CREATININE 0.91 09/23/2023   EGFR 67 09/23/2023   CALCIUM  10.1 09/23/2023   PHOS 3.3 04/16/2012  PROT 8.1 09/23/2023   ALBUMIN 4.9 (H) 09/23/2023   LABGLOB 3.2 09/23/2023   BILITOT 0.5 09/23/2023   ALKPHOS 69 09/23/2023   AST 36 09/23/2023   ALT 25 09/23/2023   ANIONGAP 8 12/26/2017   Last thyroid functions Lab Results  Component Value Date   TSH 89.400 (H) 09/23/2023      The 10-year ASCVD risk score (Arnett DK, et al., 2019) is: 16.4%    Assessment & Plan:   Problem List Items Addressed This Visit       Cardiovascular and Mediastinum   Nonischemic cardiomyopathy (HCC)   Relevant Medications   amLODipine  (NORVASC ) 10 MG tablet   ramipril  (ALTACE ) 10 MG capsule   ezetimibe  (ZETIA ) 10 MG tablet   carvedilol  (COREG ) 3.125 MG tablet   atorvastatin  (LIPITOR) 80 MG tablet   Other Relevant Orders   Brain natriuretic peptide   Essential hypertension, benign   Relevant Medications   amLODipine  (NORVASC ) 10 MG tablet   ramipril  (ALTACE ) 10 MG capsule   ezetimibe  (ZETIA ) 10 MG tablet   carvedilol  (COREG ) 3.125 MG tablet   atorvastatin  (LIPITOR) 80 MG tablet   Other Relevant  Orders   Comprehensive metabolic panel with GFR     Endocrine   Hypothyroidism - Primary   Relevant Medications   levothyroxine  (SYNTHROID ) 125 MCG tablet   carvedilol  (COREG ) 3.125 MG tablet   Other Relevant Orders   TSH   T4, free  Acquired hypothyroidism -     Levothyroxine  Sodium; Take 1 tablet (125 mcg total) by mouth daily before breakfast.  Dispense: 90 tablet; Refill: 0 -     TSH -     T4, free  Nonischemic cardiomyopathy (HCC) -     amLODIPine  Besylate; Take 1 tablet (10 mg total) by mouth daily.  Dispense: 90 tablet; Refill: 0 -     Ramipril ; Take 1 capsule (10 mg total) by mouth daily.  Dispense: 90 capsule; Refill: 0 -     Ezetimibe ; Take 1 tablet (10 mg total) by mouth daily.  Dispense: 90 tablet; Refill: 0 -     Carvedilol ; Take 1 tablet (3.125 mg total) by mouth 2 (two) times daily with a meal.  Dispense: 180 tablet; Refill: 0 -     Atorvastatin  Calcium ; Take 1 tablet (80 mg total) by mouth at bedtime.  Dispense: 90 tablet; Refill: 0 -     Brain natriuretic peptide  Essential hypertension, benign -     amLODIPine  Besylate; Take 1 tablet (10 mg total) by mouth daily.  Dispense: 90 tablet; Refill: 0 -     Ramipril ; Take 1 capsule (10 mg total) by mouth daily.  Dispense: 90 capsule; Refill: 0 -     Carvedilol ; Take 1 tablet (3.125 mg total) by mouth 2 (two) times daily with a meal.  Dispense: 180 tablet; Refill: 0 -     Comprehensive metabolic panel with GFR   Pt never received mychart message. She has increased fatigue. Has hx of hypothyroidism and has TSH of 89 in Dec. To recheck TSH/T4 and go ahead and increase synthroid  from 75mcg to 125mcg. Return in 4 weeks.  Pt with chest discomfort and hx of CHF. Has yet to see cardiologist in [place]. Advised to make appt with them asap. Checking BNP and CMP today.  Go to ER for worsening chest pain.   Return in about 4 weeks (around 03/24/2024).    Manette Section, MD

## 2024-02-25 NOTE — Patient Instructions (Addendum)
 Please make an appointment with your cardiologist ASAP Go to ER for worsening chest pain

## 2024-02-26 LAB — COMPREHENSIVE METABOLIC PANEL WITH GFR
ALT: 20 IU/L (ref 0–32)
AST: 29 IU/L (ref 0–40)
Albumin: 4.7 g/dL (ref 3.8–4.8)
Alkaline Phosphatase: 90 IU/L (ref 44–121)
BUN/Creatinine Ratio: 13 (ref 12–28)
BUN: 11 mg/dL (ref 8–27)
Bilirubin Total: 0.4 mg/dL (ref 0.0–1.2)
CO2: 22 mmol/L (ref 20–29)
Calcium: 9.8 mg/dL (ref 8.7–10.3)
Chloride: 101 mmol/L (ref 96–106)
Creatinine, Ser: 0.86 mg/dL (ref 0.57–1.00)
Globulin, Total: 2.8 g/dL (ref 1.5–4.5)
Glucose: 116 mg/dL — ABNORMAL HIGH (ref 70–99)
Potassium: 4.5 mmol/L (ref 3.5–5.2)
Sodium: 141 mmol/L (ref 134–144)
Total Protein: 7.5 g/dL (ref 6.0–8.5)
eGFR: 71 mL/min/{1.73_m2} (ref >59)

## 2024-02-26 LAB — T4, FREE: Free T4: 0.34 ng/dL — ABNORMAL LOW (ref 0.82–1.77)

## 2024-02-26 LAB — BRAIN NATRIURETIC PEPTIDE: BNP: 34.3 pg/mL (ref 0.0–100.0)

## 2024-02-26 LAB — TSH: TSH: 87.8 u[IU]/mL — ABNORMAL HIGH (ref 0.450–4.500)

## 2024-03-01 ENCOUNTER — Ambulatory Visit: Payer: Self-pay | Admitting: Family Medicine

## 2024-03-03 ENCOUNTER — Ambulatory Visit: Admitting: Family Medicine

## 2024-03-24 ENCOUNTER — Encounter: Payer: Self-pay | Admitting: Family Medicine

## 2024-03-24 ENCOUNTER — Ambulatory Visit (INDEPENDENT_AMBULATORY_CARE_PROVIDER_SITE_OTHER): Admitting: Family Medicine

## 2024-03-24 VITALS — BP 117/72 | HR 82 | Temp 98.3°F | Resp 18 | Ht 62.0 in | Wt 187.7 lb

## 2024-03-24 DIAGNOSIS — I25118 Atherosclerotic heart disease of native coronary artery with other forms of angina pectoris: Secondary | ICD-10-CM | POA: Diagnosis not present

## 2024-03-24 DIAGNOSIS — E039 Hypothyroidism, unspecified: Secondary | ICD-10-CM

## 2024-03-24 DIAGNOSIS — I428 Other cardiomyopathies: Secondary | ICD-10-CM | POA: Diagnosis not present

## 2024-03-24 NOTE — Progress Notes (Signed)
 Established Patient Office Visit  Subjective   Patient ID: Tracy Buckley, female    DOB: Sep 15, 1951  Age: 73 y.o. MRN: [id number]  Chief Complaint  Patient presents with   Follow-up    Patient is here for a 1 month follow up. Patient states that she did pick up increased thyroid medication.     HPI  Hypothyroidism Pt has hx of hypothyroidism. Seen in December and had elevated TSH at 80. She was sent mychart message that she never read. She followed up with me in May and was repeated on her TSH which was the same in December. Pt reports she has received the message to start her increased dose of her thyroid medicine to 125mcg daily. She does report taking it with her other medicines in the morning.   CAD Pt has hx of CAD and MI some years ago. She was established with Dr [name] with [facility] in [place]. She moved to [place] and is back now. She wants referral to go back to see her cardiologist.    Review of Systems  All other systems reviewed and are negative.    Objective:     BP 117/72   Pulse 82   Temp 98.3 F (36.8 C) (Oral)   Resp 18   Ht 5' 2 (1.575 m)   Wt 187 lb 11.2 oz (85.1 kg)   SpO2 99%   BMI 34.33 kg/m  BP Readings from Last 3 Encounters:  03/24/24 117/72  02/25/24 132/83  09/23/23 (!) 146/92      Physical Exam Vitals and nursing note reviewed.  Constitutional:      Appearance: Normal appearance. She is normal weight.  HENT:     Head: Normocephalic and atraumatic.     Right Ear: External ear normal.     Left Ear: External ear normal.     Nose: Nose normal.     Mouth/Throat:     Mouth: Mucous membranes are moist.     Pharynx: Oropharynx is clear.  Eyes:     Conjunctiva/sclera: Conjunctivae normal.     Pupils: Pupils are equal, round, and reactive to light.  Cardiovascular:     Rate and Rhythm: Normal rate and regular rhythm.     Pulses: Normal pulses.     Heart sounds: Normal heart sounds.  Pulmonary:     Effort: Pulmonary  effort is normal.     Breath sounds: Normal breath sounds.  Skin:    General: Skin is warm.     Capillary Refill: Capillary refill takes less than 2 seconds.  Neurological:     General: No focal deficit present.     Mental Status: She is alert and oriented to person, place, and time. Mental status is at baseline.  Psychiatric:        Mood and Affect: Mood normal.        Behavior: Behavior normal.        Thought Content: Thought content normal.        Judgment: Judgment normal.    No results found for any visits on 03/24/24.  Last thyroid functions Lab Results  Component Value Date   TSH 87.800 (H) 02/25/2024      The 10-year ASCVD risk score (Arnett DK, et al., 2019) is: 13.5%    Assessment & Plan:   Problem List Items Addressed This Visit   None Acquired hypothyroidism -     TSH -     T4, free  Coronary artery disease of  native artery of native heart with stable angina pectoris [facility]) -     Ambulatory referral to Cardiology  Nonischemic cardiomyopathy [facility]) -     Ambulatory referral to Cardiology   Pt with hx of hypothyroidism. To recheck tsh/t4. Continue Synthroid  125mcg daily for now pending results. To refer pt to cardiologist to re-establish care due to hx of CAD.   No follow-ups on file.    Manette Section, MD

## 2024-03-25 ENCOUNTER — Ambulatory Visit: Payer: Self-pay | Admitting: Family Medicine

## 2024-03-25 ENCOUNTER — Other Ambulatory Visit: Payer: Self-pay | Admitting: Family Medicine

## 2024-03-25 DIAGNOSIS — E039 Hypothyroidism, unspecified: Secondary | ICD-10-CM

## 2024-03-25 LAB — T4, FREE: Free T4: 1.51 ng/dL (ref 0.82–1.77)

## 2024-03-25 LAB — TSH: TSH: 0.809 u[IU]/mL (ref 0.450–4.500)

## 2024-03-25 MED ORDER — LEVOTHYROXINE SODIUM 75 MCG PO TABS: 75.0000 ug | ORAL_TABLET | Freq: Every day | ORAL | 2 refills | Status: DC

## 2024-05-05 ENCOUNTER — Ambulatory Visit: Admitting: Family Medicine

## 2024-05-19 ENCOUNTER — Ambulatory Visit: Admitting: Family Medicine

## 2024-07-10 ENCOUNTER — Other Ambulatory Visit: Payer: Self-pay | Admitting: Family Medicine

## 2024-07-10 DIAGNOSIS — E039 Hypothyroidism, unspecified: Secondary | ICD-10-CM
